# Patient Record
Sex: Female | Born: 1980 | Race: Black or African American | Hispanic: No | Marital: Single | Smoking: Former smoker
Health system: Southern US, Community
[De-identification: ages and names within clinical notes are randomized; demographics above are authoritative.]

## PROBLEM LIST (undated history)

## (undated) DIAGNOSIS — I10 Essential (primary) hypertension: Secondary | ICD-10-CM

## (undated) HISTORY — DX: Essential (primary) hypertension: I10

---

## 2002-05-31 ENCOUNTER — Emergency Department (HOSPITAL_COMMUNITY): Admission: EM | Admit: 2002-05-31 | Discharge: 2002-06-01 | Payer: Self-pay | Admitting: *Deleted

## 2002-05-31 ENCOUNTER — Encounter: Payer: Self-pay | Admitting: *Deleted

## 2002-06-01 ENCOUNTER — Encounter: Payer: Self-pay | Admitting: *Deleted

## 2002-06-05 ENCOUNTER — Emergency Department (HOSPITAL_COMMUNITY): Admission: EM | Admit: 2002-06-05 | Discharge: 2002-06-05 | Payer: Self-pay | Admitting: *Deleted

## 2008-11-04 DIAGNOSIS — I10 Essential (primary) hypertension: Secondary | ICD-10-CM

## 2008-11-04 HISTORY — DX: Essential (primary) hypertension: I10

## 2012-10-28 ENCOUNTER — Encounter (HOSPITAL_COMMUNITY): Payer: Self-pay | Admitting: *Deleted

## 2012-10-28 ENCOUNTER — Emergency Department (HOSPITAL_COMMUNITY)
Admission: EM | Admit: 2012-10-28 | Discharge: 2012-10-28 | Disposition: A | Discharge status: Home / Self Care | Payer: Self-pay | Attending: Emergency Medicine | Admitting: Emergency Medicine

## 2012-10-28 DIAGNOSIS — F17200 Nicotine dependence, unspecified, uncomplicated: Secondary | ICD-10-CM | POA: Insufficient documentation

## 2012-10-28 DIAGNOSIS — R05 Cough: Secondary | ICD-10-CM

## 2012-10-28 DIAGNOSIS — R6883 Chills (without fever): Secondary | ICD-10-CM | POA: Insufficient documentation

## 2012-10-28 DIAGNOSIS — R51 Headache: Secondary | ICD-10-CM | POA: Insufficient documentation

## 2012-10-28 DIAGNOSIS — R5381 Other malaise: Secondary | ICD-10-CM | POA: Insufficient documentation

## 2012-10-28 DIAGNOSIS — J329 Chronic sinusitis, unspecified: Secondary | ICD-10-CM | POA: Insufficient documentation

## 2012-10-28 DIAGNOSIS — J069 Acute upper respiratory infection, unspecified: Secondary | ICD-10-CM | POA: Insufficient documentation

## 2012-10-28 DIAGNOSIS — R52 Pain, unspecified: Secondary | ICD-10-CM | POA: Insufficient documentation

## 2012-10-28 MED ORDER — OXYCODONE-ACETAMINOPHEN 5-325 MG PO TABS
1.0000 | ORAL_TABLET | Freq: Once | ORAL | Status: AC
  Administered 2012-10-28: 1 via ORAL
  Filled 2012-10-28: qty 1

## 2012-10-28 MED ORDER — BENZONATATE 100 MG PO CAPS: 100.0000 mg | ORAL_CAPSULE | Freq: Three times a day (TID) | ORAL | Status: DC | PRN

## 2012-10-28 MED ORDER — ALBUTEROL SULFATE HFA 108 (90 BASE) MCG/ACT IN AERS: INHALATION_SPRAY | RESPIRATORY_TRACT | Status: DC

## 2012-10-28 MED ORDER — IBUPROFEN 400 MG PO TABS
400.0000 mg | ORAL_TABLET | Freq: Once | ORAL | Status: AC
  Administered 2012-10-28: 400 mg via ORAL
  Filled 2012-10-28: qty 1

## 2012-10-28 MED ORDER — TRAMADOL HCL 50 MG PO TABS: 50.0000 mg | ORAL_TABLET | Freq: Four times a day (QID) | ORAL | Status: DC | PRN

## 2012-10-28 NOTE — ED Provider Notes (Signed)
History     CSN: [id number]  Arrival date & time 10/28/12  1541   First MD Initiated Contact with Patient 10/28/12 1624      Chief Complaint  Patient presents with  . Facial Pain  . Cough  . Nasal Congestion     HPI Pt was seen at 1630.   Per pt, c/o gradual onset and persistence of constant runny/stuffy nose, sinus and ears congestion, chills, nausea, generalized body aches/fatigue and cough for the past 2-3 days.  States her face "hurts" when she coughs or bends over forward.  Denies fevers, no rash, no CP/SOB, no vomiting/diarrhea, no abd pain.     History reviewed. No pertinent past medical history.  History reviewed. No pertinent past surgical history.   History  Substance Use Topics  . Smoking status: Current Every Day Smoker    Types: Cigarettes  . Smokeless tobacco: Not on file  . Alcohol Use: No    Review of Systems ROS: Statement: All systems negative except as marked or noted in the HPI; Constitutional: Negative for fever and +chills, generalized weakness/fatigue.. ; ; Eyes: Negative for eye pain, redness and discharge. ; ; ENMT: Negative for ear pain, hoarseness, sore throat. +nasal and ears congestion, sinus pressure, "facial pain," and rhinorrhea. ; ; Cardiovascular: Negative for chest pain, palpitations, diaphoresis, dyspnea and peripheral edema. ; ; Respiratory: +cough. Negative for wheezing and stridor. ; ; Gastrointestinal: +nausea. Negative for vomiting, diarrhea, abdominal pain, blood in stool, hematemesis, jaundice and rectal bleeding. . ; ; Genitourinary: Negative for dysuria, flank pain and hematuria. ; ; Musculoskeletal: Negative for back pain and neck pain. Negative for swelling and trauma.; ; Skin: Negative for pruritus, rash, abrasions, blisters, bruising and skin lesion.; ; Neuro: Negative for headache, lightheadedness and neck stiffness. Negative for weakness, altered level of consciousness , altered mental status, extremity weakness, paresthesias,  involuntary movement, seizure and syncope.     Allergies  Review of patient's allergies indicates no known allergies.  Home Medications   Current Outpatient Rx  Name  Route  Sig  Dispense  Refill  . ALBUTEROL SULFATE HFA 108 (90 BASE) MCG/ACT IN AERS      2 to 4 puffs every 4 hours for the next 7 days, then as needed for cough, wheezing, or shortness of breath   1 Inhaler   0   . BENZONATATE 100 MG PO CAPS   Oral   Take 1 capsule (100 mg total) by mouth 3 (three) times daily as needed for cough.   15 capsule   0   . TRAMADOL HCL 50 MG PO TABS   Oral   Take 1 tablet (50 mg total) by mouth every 6 (six) hours as needed for pain.   15 tablet   0     BP 135/70  Pulse 134  Temp 98.5 F (36.9 C) (Oral)  Resp 20  Ht 5\' 3"  (1.6 m)  Wt 135 lb (61.236 kg)  BMI 23.91 kg/m2  SpO2 100%  LMP 10/23/2012  Physical Exam 1635: Physical examination:  Nursing notes reviewed; Vital signs and O2 SAT reviewed;  Constitutional: Well developed, Well nourished, Well hydrated, In no acute distress; Head:  Normocephalic, atraumatic; Eyes: EOMI, PERRL, No scleral icterus; ENMT: TM's clear bilat. +edemetous nasal turbinates bilat with clear rhinorrhea. +TTP frontal and bilat maxillary sinuses to percuss. Mouth and pharynx without lesions. No tonsillar exudates. No intra-oral edema. No hoarse voice, no drooling, no stridor.  Mouth and pharynx normal, Mucous membranes moist; Neck:  Supple, Full range of motion, No lymphadenopathy; Cardiovascular: Regular rate and rhythm, No murmur, rub, or gallop; Respiratory: Breath sounds clear & equal bilaterally, No rales, rhonchi, wheezes. +non-productive cough during exam. Speaking full sentences with ease, Normal respiratory effort/excursion; Chest: Nontender, Movement normal;; Extremities: Pulses normal, No tenderness, No edema, No calf edema or asymmetry.; Neuro: AA&Ox3, Major CN grossly intact.  Speech clear. Climbs on and off stretcher by herself without  distress. Gait steady. No gross focal motor or sensory deficits in extremities.; Skin: Color normal, Warm, Dry.   ED Course  Procedures    MDM  MDM Reviewed: nursing note and vitals      1645:  Appears URI at this time.  Will tx symptomatically.  Dx d/w pt and family.  Questions answered.  Verb understanding, agreeable to d/c home with outpt f/u.       Laray Anger, DO 10/30/12 [phone]

## 2012-10-28 NOTE — ED Notes (Signed)
Pt c/o HA x  2 days with nausea, denies vomiting, sensitive to light , denies hx of migraines, also c/o cold with coughing pressure to head

## 2013-06-30 ENCOUNTER — Encounter (HOSPITAL_COMMUNITY): Payer: Self-pay | Admitting: Emergency Medicine

## 2013-06-30 ENCOUNTER — Emergency Department (HOSPITAL_COMMUNITY)
Admission: EM | Admit: 2013-06-30 | Discharge: 2013-06-30 | Disposition: A | Discharge status: Home / Self Care | Payer: Self-pay | Attending: Emergency Medicine | Admitting: Emergency Medicine

## 2013-06-30 DIAGNOSIS — F17200 Nicotine dependence, unspecified, uncomplicated: Secondary | ICD-10-CM | POA: Insufficient documentation

## 2013-06-30 DIAGNOSIS — K0889 Other specified disorders of teeth and supporting structures: Secondary | ICD-10-CM

## 2013-06-30 DIAGNOSIS — K029 Dental caries, unspecified: Secondary | ICD-10-CM | POA: Insufficient documentation

## 2013-06-30 DIAGNOSIS — R51 Headache: Secondary | ICD-10-CM | POA: Insufficient documentation

## 2013-06-30 DIAGNOSIS — K089 Disorder of teeth and supporting structures, unspecified: Secondary | ICD-10-CM | POA: Insufficient documentation

## 2013-06-30 MED ORDER — AMOXICILLIN-POT CLAVULANATE 875-125 MG PO TABS
1.0000 | ORAL_TABLET | Freq: Once | ORAL | Status: AC
  Administered 2013-06-30: 1 via ORAL
  Filled 2013-06-30: qty 1

## 2013-06-30 MED ORDER — MELOXICAM 7.5 MG PO TABS: ORAL_TABLET | ORAL | Status: DC

## 2013-06-30 MED ORDER — TRAMADOL HCL 50 MG PO TABS
50.0000 mg | ORAL_TABLET | Freq: Once | ORAL | Status: AC
  Administered 2013-06-30: 50 mg via ORAL
  Filled 2013-06-30: qty 1

## 2013-06-30 MED ORDER — PROMETHAZINE HCL 12.5 MG PO TABS
12.5000 mg | ORAL_TABLET | Freq: Once | ORAL | Status: AC
  Administered 2013-06-30: 12.5 mg via ORAL
  Filled 2013-06-30: qty 1

## 2013-06-30 MED ORDER — AMOXICILLIN 500 MG PO CAPS: 500.0000 mg | ORAL_CAPSULE | Freq: Three times a day (TID) | ORAL | Status: DC

## 2013-06-30 MED ORDER — KETOROLAC TROMETHAMINE 10 MG PO TABS
10.0000 mg | ORAL_TABLET | Freq: Once | ORAL | Status: AC
  Administered 2013-06-30: 10 mg via ORAL
  Filled 2013-06-30: qty 1

## 2013-06-30 MED ORDER — TRAMADOL HCL 50 MG PO TABS: ORAL_TABLET | ORAL | Status: DC

## 2013-06-30 NOTE — ED Notes (Signed)
Dental pain rt side of face.

## 2013-06-30 NOTE — ED Provider Notes (Signed)
Medical screening examination/treatment/procedure(s) were performed by non-physician practitioner and as supervising physician I was immediately available for consultation/collaboration.  Zanyah Lentsch R. Inaaya Vellucci, MD 06/30/13 2355 

## 2013-06-30 NOTE — ED Notes (Signed)
Pt c/o dental pain x 2 days.  

## 2013-06-30 NOTE — ED Provider Notes (Signed)
CSN: [id number]     Arrival date & time 06/30/13  2152 History   First MD Initiated Contact with Patient 06/30/13 2250     Chief Complaint  Patient presents with  . Dental Pain   (Consider location/radiation/quality/duration/timing/severity/associated sxs/prior Treatment) Patient is a 32 y.o. female presenting with tooth pain. The history is provided by the patient.  Dental Pain Location:  Lower Quality:  Throbbing Severity:  Severe Onset quality:  Gradual Duration:  3 days Timing:  Intermittent Progression:  Worsening Chronicity:  New Context: dental caries   Context: not trauma   Relieved by:  Nothing Ineffective treatments:  NSAIDs Associated symptoms: facial pain, gum swelling and headaches   Associated symptoms: no difficulty swallowing and no neck pain   Risk factors: smoking     History reviewed. No pertinent past medical history. History reviewed. No pertinent past surgical history. History reviewed. No pertinent family history. History  Substance Use Topics  . Smoking status: Current Every Day Smoker    Types: Cigarettes  . Smokeless tobacco: Not on file  . Alcohol Use: No   OB History   Grav Para Term Preterm Abortions TAB SAB Ect Mult Living                 Review of Systems  Constitutional: Negative for activity change.       All ROS Neg except as noted in HPI  HENT: Negative for nosebleeds and neck pain.   Eyes: Negative for photophobia and discharge.  Respiratory: Negative for cough, shortness of breath and wheezing.   Cardiovascular: Negative for chest pain and palpitations.  Gastrointestinal: Negative for abdominal pain and blood in stool.  Genitourinary: Negative for dysuria, frequency and hematuria.  Musculoskeletal: Negative for back pain and arthralgias.  Skin: Negative.   Neurological: Positive for headaches. Negative for dizziness, seizures and speech difficulty.  Psychiatric/Behavioral: Negative for hallucinations and confusion.     Allergies  Review of patient's allergies indicates no known allergies.  Home Medications   Current Outpatient Rx  Name  Route  Sig  Dispense  Refill  . benzocaine (ORAJEL) 10 % mucosal gel   Mouth/Throat   Use as directed 1 application in the mouth or throat as needed for pain.         . hydrogen peroxide 3 % external solution   Oral   Take by mouth as needed (Swish and spit as needed for dental pain).         Marland Kitchen ibuprofen (ADVIL,MOTRIN) 200 MG tablet   Oral   Take 800 mg by mouth every 4 (four) hours as needed for pain.         . naproxen sodium (ALEVE) 220 MG tablet   Oral   Take 220 mg by mouth as needed (for pain).         Marland Kitchen amoxicillin (AMOXIL) 500 MG capsule   Oral   Take 1 capsule (500 mg total) by mouth 3 (three) times daily.   21 capsule   0   . meloxicam (MOBIC) 7.5 MG tablet      1 po bid with food   14 tablet   0   . traMADol (ULTRAM) 50 MG tablet      1 or 2 po q6h prn pain, take with food   20 tablet   0    BP 138/89  Pulse 86  Temp(Src) 98 F (36.7 C) (Oral)  Resp 20  Ht 5\' 2"  (1.575 m)  Wt 187 lb 4.8  oz (84.959 kg)  BMI 34.25 kg/m2  SpO2 99%  LMP 06/29/2013 Physical Exam  Nursing note and vitals reviewed. Constitutional: She is oriented to person, place, and time. She appears well-developed and well-nourished.  Non-toxic appearance.  HENT:  Head: Normocephalic.  Right Ear: Tympanic membrane and external ear normal.  Left Ear: Tympanic membrane and external ear normal.  Dental caries of the right upper and lower jaws. Gum swelling of the lower jaw. No swelling under the tongue. Airway patent.  Eyes: EOM and lids are normal. Pupils are equal, round, and reactive to light.  Neck: Normal range of motion. Neck supple. Carotid bruit is not present.  Cardiovascular: Normal rate, regular rhythm, normal heart sounds, intact distal pulses and normal pulses.   Pulmonary/Chest: Breath sounds normal. No respiratory distress.  Abdominal:  Soft. Bowel sounds are normal. There is no tenderness. There is no guarding.  Musculoskeletal: Normal range of motion.  Lymphadenopathy:       Head (right side): No submandibular adenopathy present.       Head (left side): No submandibular adenopathy present.    She has no cervical adenopathy.  Neurological: She is alert and oriented to person, place, and time. She has normal strength. No cranial nerve deficit or sensory deficit.  Skin: Skin is warm and dry.  Psychiatric: She has a normal mood and affect. Her speech is normal.    ED Course  Procedures (including critical care time) Labs Review Labs Reviewed - No data to display Imaging Review No results found.  MDM   1. Toothache    **I have reviewed nursing notes, vital signs, and all appropriate lab and imaging results for this patient.*  Pt presents to ED with 3 days of tooth pain, face pain, and headache. Pt has dental caries on the top and bottom right jaws. Rx for amoxil, mobic, and tramadol given to the patient. Dental resource sheets given.  Kathie Dike, PA-C 06/30/13 2328

## 2014-09-01 ENCOUNTER — Encounter (HOSPITAL_COMMUNITY): Payer: Self-pay | Admitting: Emergency Medicine

## 2014-09-01 ENCOUNTER — Emergency Department (HOSPITAL_COMMUNITY): Payer: Self-pay

## 2014-09-01 ENCOUNTER — Emergency Department (HOSPITAL_COMMUNITY)
Admission: EM | Admit: 2014-09-01 | Discharge: 2014-09-01 | Disposition: A | Discharge status: Home / Self Care | Payer: Self-pay | Attending: Emergency Medicine | Admitting: Emergency Medicine

## 2014-09-01 DIAGNOSIS — Z3202 Encounter for pregnancy test, result negative: Secondary | ICD-10-CM | POA: Insufficient documentation

## 2014-09-01 DIAGNOSIS — N841 Polyp of cervix uteri: Secondary | ICD-10-CM | POA: Insufficient documentation

## 2014-09-01 DIAGNOSIS — R110 Nausea: Secondary | ICD-10-CM | POA: Insufficient documentation

## 2014-09-01 DIAGNOSIS — R109 Unspecified abdominal pain: Secondary | ICD-10-CM

## 2014-09-01 DIAGNOSIS — Z720 Tobacco use: Secondary | ICD-10-CM | POA: Insufficient documentation

## 2014-09-01 DIAGNOSIS — R509 Fever, unspecified: Secondary | ICD-10-CM | POA: Insufficient documentation

## 2014-09-01 DIAGNOSIS — D259 Leiomyoma of uterus, unspecified: Secondary | ICD-10-CM | POA: Insufficient documentation

## 2014-09-01 DIAGNOSIS — R102 Pelvic and perineal pain: Secondary | ICD-10-CM

## 2014-09-01 LAB — COMPREHENSIVE METABOLIC PANEL
ALK PHOS: 106 U/L (ref 39–117)
ALT: 14 U/L (ref 0–35)
ANION GAP: 15 (ref 5–15)
AST: 18 U/L (ref 0–37)
Albumin: 4.2 g/dL (ref 3.5–5.2)
BUN: 9 mg/dL (ref 6–23)
CALCIUM: 9.4 mg/dL (ref 8.4–10.5)
CO2: 22 mEq/L (ref 19–32)
Chloride: 102 mEq/L (ref 96–112)
Creatinine, Ser: 0.72 mg/dL (ref 0.50–1.10)
GLUCOSE: 98 mg/dL (ref 70–99)
POTASSIUM: 3.2 meq/L — AB (ref 3.7–5.3)
Sodium: 139 mEq/L (ref 137–147)
TOTAL PROTEIN: 8.1 g/dL (ref 6.0–8.3)
Total Bilirubin: 0.7 mg/dL (ref 0.3–1.2)

## 2014-09-01 LAB — CBC WITH DIFFERENTIAL/PLATELET
Basophils Absolute: 0 10*3/uL (ref 0.0–0.1)
Basophils Relative: 0 % (ref 0–1)
EOS ABS: 0.1 10*3/uL (ref 0.0–0.7)
EOS PCT: 1 % (ref 0–5)
HCT: 39.1 % (ref 36.0–46.0)
HEMOGLOBIN: 12.9 g/dL (ref 12.0–15.0)
LYMPHS ABS: 3.6 10*3/uL (ref 0.7–4.0)
Lymphocytes Relative: 37 % (ref 12–46)
MCH: 25.2 pg — AB (ref 26.0–34.0)
MCHC: 33 g/dL (ref 30.0–36.0)
MCV: 76.4 fL — AB (ref 78.0–100.0)
MONOS PCT: 7 % (ref 3–12)
Monocytes Absolute: 0.7 10*3/uL (ref 0.1–1.0)
Neutro Abs: 5.3 10*3/uL (ref 1.7–7.7)
Neutrophils Relative %: 55 % (ref 43–77)
Platelets: 284 10*3/uL (ref 150–400)
RBC: 5.12 MIL/uL — AB (ref 3.87–5.11)
RDW: 14.6 % (ref 11.5–15.5)
WBC: 9.7 10*3/uL (ref 4.0–10.5)

## 2014-09-01 LAB — URINALYSIS, ROUTINE W REFLEX MICROSCOPIC
Glucose, UA: NEGATIVE mg/dL
KETONES UR: NEGATIVE mg/dL
Leukocytes, UA: NEGATIVE
Nitrite: NEGATIVE
PROTEIN: 30 mg/dL — AB
Specific Gravity, Urine: >1.046 — ABNORMAL HIGH (ref 1.005–1.030)
UROBILINOGEN UA: 1 mg/dL (ref 0.0–1.0)
pH: 6 (ref 5.0–8.0)

## 2014-09-01 LAB — URINE MICROSCOPIC-ADD ON

## 2014-09-01 LAB — PREGNANCY, URINE: PREG TEST UR: NEGATIVE

## 2014-09-01 LAB — WET PREP, GENITAL
Clue Cells Wet Prep HPF POC: NONE SEEN
Trich, Wet Prep: NONE SEEN
WBC WET PREP: NONE SEEN
YEAST WET PREP: NONE SEEN

## 2014-09-01 LAB — LIPASE, BLOOD: Lipase: 17 U/L (ref 11–59)

## 2014-09-01 MED ORDER — FENTANYL CITRATE 0.05 MG/ML IJ SOLN
100.0000 ug | Freq: Once | INTRAMUSCULAR | Status: AC
  Administered 2014-09-01: 100 ug via INTRAVENOUS
  Filled 2014-09-01: qty 2

## 2014-09-01 MED ORDER — SODIUM CHLORIDE 0.9 % IV BOLUS (SEPSIS)
1000.0000 mL | Freq: Once | INTRAVENOUS | Status: AC
Start: 2014-09-01 — End: 2014-09-01
  Administered 2014-09-01: 1000 mL via INTRAVENOUS

## 2014-09-01 MED ORDER — ONDANSETRON HCL 4 MG/2ML IJ SOLN
4.0000 mg | Freq: Once | INTRAMUSCULAR | Status: AC
  Administered 2014-09-01: 4 mg via INTRAVENOUS
  Filled 2014-09-01: qty 2

## 2014-09-01 MED ORDER — TRAMADOL HCL 50 MG PO TABS: 50.0000 mg | ORAL_TABLET | Freq: Four times a day (QID) | ORAL | Status: DC | PRN

## 2014-09-01 NOTE — ED Notes (Signed)
Pt is c/o bilateral flank pain and urinary retention   Pt states the pain first started on the left and has now spread to both sides  Pt states her sides are tender and has a burning sensation to the kidney areas  Pt states when she goes to urinate she can only dribble

## 2014-09-01 NOTE — ED Notes (Signed)
Pelvic exam has been set up.

## 2014-09-01 NOTE — ED Provider Notes (Signed)
CSN: [id number]     Arrival date & time 09/01/14  0004 History   First MD Initiated Contact with Patient 09/01/14 0018     Chief Complaint  Patient presents with  . Flank Pain   Patient is a 33 y.o. female presenting with flank pain. The history is provided by the patient. No language interpreter was used.  Flank Pain This is a new problem. The current episode started 1 to 4 weeks ago. The problem occurs constantly. The problem has been gradually worsening. Associated symptoms include abdominal pain, chills, fatigue, a fever, nausea and urinary symptoms. Pertinent negatives include no anorexia, arthralgias, change in bowel habit, chest pain, congestion, coughing, diaphoresis, headaches, joint swelling, myalgias, neck pain, numbness, rash, sore throat, swollen glands, vomiting or weakness.     History reviewed. No pertinent past medical history. History reviewed. No pertinent past surgical history. Family History  Problem Relation Age of Onset  . Hypertension Other   . Cancer Other    History  Substance Use Topics  . Smoking status: Current Every Day Smoker    Types: Cigarettes  . Smokeless tobacco: Not on file  . Alcohol Use: No   OB History   Grav Para Term Preterm Abortions TAB SAB Ect Mult Living                 Review of Systems  Constitutional: Positive for fever, chills and fatigue. Negative for diaphoresis.  HENT: Negative for congestion and sore throat.   Respiratory: Negative for cough and chest tightness.   Cardiovascular: Negative for chest pain.  Gastrointestinal: Positive for nausea and abdominal pain. Negative for vomiting, anorexia and change in bowel habit.  Genitourinary: Positive for flank pain. Negative for dysuria, urgency, frequency, hematuria, vaginal bleeding, vaginal discharge, difficulty urinating and vaginal pain.  Musculoskeletal: Positive for back pain. Negative for arthralgias, joint swelling, myalgias and neck pain.  Skin: Negative for rash.   Neurological: Negative for weakness, numbness and headaches.  All other systems reviewed and are negative.     Allergies  Review of patient's allergies indicates no known allergies.  Home Medications   Prior to Admission medications   Medication Sig Start Date End Date Taking? Authorizing Provider  acetaminophen (TYLENOL) 325 MG tablet Take by mouth every 6 (six) hours as needed for mild pain.   Yes Historical Provider, MD   BP 150/93  Pulse 64  Temp(Src) 97.8 F (36.6 C) (Oral)  Resp 16  SpO2 100%  LMP 08/31/2014 Physical Exam  Nursing note and vitals reviewed. Constitutional: She is oriented to person, place, and time. She appears well-developed and well-nourished. No distress.  HENT:  Head: Normocephalic and atraumatic.  Mouth/Throat: Oropharynx is clear and moist. No oropharyngeal exudate.  Eyes: Conjunctivae and EOM are normal. Pupils are equal, round, and reactive to light. No scleral icterus.  Neck: Normal range of motion. Neck supple. No JVD present. No thyromegaly present.  Cardiovascular: Normal rate, regular rhythm, normal heart sounds and intact distal pulses.  Exam reveals no gallop and no friction rub.   No murmur heard. Pulmonary/Chest: Effort normal and breath sounds normal. No respiratory distress. She has no wheezes. She has no rales. She exhibits no tenderness.  Abdominal: Soft. Normal appearance and bowel sounds are normal. She exhibits no distension and no mass. There is tenderness in the suprapubic area. There is CVA tenderness (bilateral). There is no rigidity, no rebound, no guarding, no tenderness at McBurney's point and negative Murphy's sign.  Genitourinary: No labial fusion. There  is no rash, tenderness, lesion or injury on the right labia. There is no rash, tenderness, lesion or injury on the left labia. Uterus is tender. Cervix exhibits motion tenderness. Cervix exhibits no discharge and no friability. Right adnexum displays tenderness. Right adnexum  displays no mass and no fullness. Left adnexum displays tenderness. Left adnexum displays no mass and no fullness. There is tenderness around the vagina. No erythema or bleeding around the vagina. No foreign body around the vagina. No signs of injury around the vagina. No vaginal discharge found.  Polyp located on the cervix.   Musculoskeletal: Normal range of motion.  Lymphadenopathy:    She has no cervical adenopathy.  Neurological: She is alert and oriented to person, place, and time. She has normal strength. No cranial nerve deficit or sensory deficit. Coordination normal.  Skin: Skin is warm and dry. She is not diaphoretic.  Psychiatric: She has a normal mood and affect. Her behavior is normal. Judgment and thought content normal.    ED Course  Procedures (including critical care time) Labs Review Labs Reviewed  CBC WITH DIFFERENTIAL - Abnormal; Notable for the following:    RBC 5.12 (*)    MCV 76.4 (*)    MCH 25.2 (*)    All other components within normal limits  COMPREHENSIVE METABOLIC PANEL - Abnormal; Notable for the following:    Potassium 3.2 (*)    All other components within normal limits  URINALYSIS, ROUTINE W REFLEX MICROSCOPIC - Abnormal; Notable for the following:    APPearance CLOUDY (*)    Specific Gravity, Urine >1.046 (*)    Hgb urine dipstick LARGE (*)    Bilirubin Urine SMALL (*)    Protein, ur 30 (*)    All other components within normal limits  URINE MICROSCOPIC-ADD ON - Abnormal; Notable for the following:    Squamous Epithelial / LPF FEW (*)    All other components within normal limits  WET PREP, GENITAL  GC/CHLAMYDIA PROBE AMP  LIPASE, BLOOD  PREGNANCY, URINE    Imaging Review Ct Abdomen Pelvis Wo Contrast  09/01/2014   CLINICAL DATA:  Left flank pain, urinary retention.  EXAM: CT ABDOMEN AND PELVIS WITHOUT CONTRAST  TECHNIQUE: Multidetector CT imaging of the abdomen and pelvis was performed following the standard protocol without IV contrast.   COMPARISON:  06/01/2002  FINDINGS: Mild lingular opacity.  Normal heart size.  Organ evaluation limited without intravenous contrast. Within this limitation, small rounded hypodensity right hepatic lobe posteriorly series 2, image 11.  No radiodense gallstones or biliary ductal dilatation.  No appreciable abnormality of the spleen, pancreas, adrenal glands.  Symmetric renal size. No hydroureteronephrosis or urinary tract calculi identified.  No overt colitis. Normal appendix. Small bowel loops are of normal course and caliber. Small fat containing umbilical hernia. No free intraperitoneal air or fluid. No lymphadenopathy.  Normal caliber aorta and branch vessels.  Thin walled bladder. Calcified uterine lesion, favored to reflect a fibroid. No adnexal mass. Tampon within the vagina.  No acute osseous finding.  IMPRESSION: No hydroureteronephrosis or urinary tract calculi.  Uterine fibroid.  Mild lingular opacity, favor atelectasis.  Nonspecific sub cm hypodensity right hepatic lobe, has a nonaggressive appearance.   Electronically Signed   By: Carlos Levering M.D.   On: 09/01/2014 03:34     EKG Interpretation None      MDM   Final diagnoses:  Flank pain  Pelvic pain in female   Patient is a 33 y.o. Female who presents to the ED  with bilateral flank pain and dysuria.  Physical exam reveals uncomfortable appearing female in no apparent distress with with bilateral CVA tenderness and pelvic tenderness to palpation of pelvic exam.  UA had 0-2 RBCs and no evidence of UTI.  CBC is unremarkable.  CMP reveals mild hypokalemia.  Urine pregnancy is negative.  Wet prep is negative.  GC pending.  CT abdomen and pelvis was performed to rule out kidney stones.  There is no evidence of kidney or ureteral stones.  Given tenderness to palpation of the uterus, cervical polyp, and adnexal tenderness pelvic ultrasound was ordered.  Patient to be signed out with Noland Fordyce PA-C at shift change.  If the pelvic ultrasound  is normal patient is stable for discharge and can [facility] and [facility] and should be discharged home with a short course of pain medication.  Patient has been discussed with Dr. Sharol Given who agrees with the above plan and workup.       Cherylann Parr, PA-C 09/01/14 [phone]

## 2014-09-01 NOTE — ED Provider Notes (Signed)
Medical screening examination/treatment/procedure(s) were performed by non-physician practitioner and as supervising physician I was immediately available for consultation/collaboration.   EKG Interpretation None       Kalman Drape, MD 09/01/14 0700

## 2014-09-01 NOTE — ED Provider Notes (Signed)
Pt signed out at shift change. Plan to f/u on pelvic U/S. 7:38 AM Pt refused transvaginal U/S stating she is on her cycle and has a tampon in, plans to f/u with a GYN anyways.  Labs: unremarkable.  Pt states her pain is better and she wants to go home. Medical records reviewed, no evidence of emergent process taking place at this time. Will discharge home with pain medication and resource guide for [facility]. Return precautions provided. Pt verbalized understanding and agreement with tx plan.    Noland Fordyce, PA-C 09/01/14 [phone]

## 2014-09-02 LAB — GC/CHLAMYDIA PROBE AMP
CT PROBE, AMP APTIMA: NEGATIVE
GC PROBE AMP APTIMA: NEGATIVE

## 2014-09-02 NOTE — ED Provider Notes (Signed)
Medical screening examination/treatment/procedure(s) were performed by non-physician practitioner and as supervising physician I was immediately available for consultation/collaboration.   EKG Interpretation None       Kalman Drape, MD 09/02/14 [phone]

## 2014-11-05 ENCOUNTER — Encounter (HOSPITAL_COMMUNITY): Payer: Self-pay | Admitting: Emergency Medicine

## 2014-11-05 ENCOUNTER — Emergency Department (HOSPITAL_COMMUNITY): Payer: Self-pay

## 2014-11-05 ENCOUNTER — Emergency Department (HOSPITAL_COMMUNITY)
Admission: EM | Admit: 2014-11-05 | Discharge: 2014-11-05 | Disposition: A | Discharge status: Home / Self Care | Payer: Self-pay | Attending: Emergency Medicine | Admitting: Emergency Medicine

## 2014-11-05 DIAGNOSIS — Z720 Tobacco use: Secondary | ICD-10-CM | POA: Insufficient documentation

## 2014-11-05 DIAGNOSIS — R1011 Right upper quadrant pain: Secondary | ICD-10-CM | POA: Insufficient documentation

## 2014-11-05 DIAGNOSIS — R630 Anorexia: Secondary | ICD-10-CM | POA: Insufficient documentation

## 2014-11-05 DIAGNOSIS — Z8742 Personal history of other diseases of the female genital tract: Secondary | ICD-10-CM | POA: Insufficient documentation

## 2014-11-05 DIAGNOSIS — Z3202 Encounter for pregnancy test, result negative: Secondary | ICD-10-CM | POA: Insufficient documentation

## 2014-11-05 DIAGNOSIS — R112 Nausea with vomiting, unspecified: Secondary | ICD-10-CM | POA: Insufficient documentation

## 2014-11-05 LAB — COMPREHENSIVE METABOLIC PANEL WITH GFR
ALT: 18 U/L (ref 0–35)
AST: 21 U/L (ref 0–37)
Albumin: 4 g/dL (ref 3.5–5.2)
Alkaline Phosphatase: 100 U/L (ref 39–117)
Anion gap: 9 (ref 5–15)
BUN: 9 mg/dL (ref 6–23)
CO2: 25 mmol/L (ref 19–32)
Calcium: 8.6 mg/dL (ref 8.4–10.5)
Chloride: 106 meq/L (ref 96–112)
Creatinine, Ser: 0.73 mg/dL (ref 0.50–1.10)
GFR calc Af Amer: >90 mL/min (ref >90)
GFR calc non Af Amer: >90 mL/min (ref >90)
Glucose, Bld: 85 mg/dL (ref 70–99)
Potassium: 4 mmol/L (ref 3.5–5.1)
Sodium: 140 mmol/L (ref 135–145)
Total Bilirubin: 0.9 mg/dL (ref 0.3–1.2)
Total Protein: 7.4 g/dL (ref 6.0–8.3)

## 2014-11-05 LAB — CBC WITH DIFFERENTIAL/PLATELET
BASOS ABS: 0 10*3/uL (ref 0.0–0.1)
BASOS PCT: 0 % (ref 0–1)
EOS PCT: 1 % (ref 0–5)
Eosinophils Absolute: 0.1 10*3/uL (ref 0.0–0.7)
HEMATOCRIT: 39.2 % (ref 36.0–46.0)
HEMOGLOBIN: 12.5 g/dL (ref 12.0–15.0)
Lymphocytes Relative: 32 % (ref 12–46)
Lymphs Abs: 2.7 10*3/uL (ref 0.7–4.0)
MCH: 25 pg — AB (ref 26.0–34.0)
MCHC: 31.9 g/dL (ref 30.0–36.0)
MCV: 78.4 fL (ref 78.0–100.0)
MONO ABS: 0.5 10*3/uL (ref 0.1–1.0)
MONOS PCT: 5 % (ref 3–12)
Neutro Abs: 5.3 10*3/uL (ref 1.7–7.7)
Neutrophils Relative %: 62 % (ref 43–77)
Platelets: 335 10*3/uL (ref 150–400)
RBC: 5 MIL/uL (ref 3.87–5.11)
RDW: 15.1 % (ref 11.5–15.5)
WBC: 8.6 10*3/uL (ref 4.0–10.5)

## 2014-11-05 LAB — POC URINE PREG, ED: Preg Test, Ur: NEGATIVE

## 2014-11-05 LAB — URINALYSIS, ROUTINE W REFLEX MICROSCOPIC
Glucose, UA: NEGATIVE mg/dL
Hgb urine dipstick: NEGATIVE
Ketones, ur: NEGATIVE mg/dL
Leukocytes, UA: NEGATIVE
Nitrite: NEGATIVE
Protein, ur: NEGATIVE mg/dL
Specific Gravity, Urine: 1.026 (ref 1.005–1.030)
Urobilinogen, UA: 0.2 mg/dL (ref 0.0–1.0)
pH: 5.5 (ref 5.0–8.0)

## 2014-11-05 LAB — LIPASE, BLOOD: Lipase: 19 U/L (ref 11–59)

## 2014-11-05 MED ORDER — ONDANSETRON 8 MG PO TBDP
8.0000 mg | ORAL_TABLET | Freq: Once | ORAL | Status: AC
  Administered 2014-11-05: 8 mg via ORAL
  Filled 2014-11-05: qty 1

## 2014-11-05 MED ORDER — ONDANSETRON HCL 4 MG PO TABS: 4.0000 mg | ORAL_TABLET | Freq: Four times a day (QID) | ORAL | Status: DC

## 2014-11-05 MED ORDER — KETOROLAC TROMETHAMINE 30 MG/ML IJ SOLN
30.0000 mg | Freq: Once | INTRAMUSCULAR | Status: AC
  Administered 2014-11-05: 30 mg via INTRAVENOUS
  Filled 2014-11-05: qty 1

## 2014-11-05 MED ORDER — SODIUM CHLORIDE 0.9 % IV BOLUS (SEPSIS)
1000.0000 mL | Freq: Once | INTRAVENOUS | Status: AC
  Administered 2014-11-05: 1000 mL via INTRAVENOUS

## 2014-11-05 NOTE — ED Provider Notes (Signed)
CSN: [id number]     Arrival date & time 11/05/14  1748 History   First MD Initiated Contact with Patient 11/05/14 1856     Chief Complaint  Patient presents with  . Flank Pain  . Abdominal Pain     (Consider location/radiation/quality/duration/timing/severity/associated sxs/prior Treatment) HPI   34 year old female with history of uterine fibroid who presents for evaluation of abdominal pain. Patient reports intermittent and colicky right upper quadrant abdominal pain ongoing for the past month. Pain is described as a burning sensation, lasting for minutes and resolved without specific treatments. She reported decrease in appetite, having occasional nausea and vomiting. Vomitus is nonbloody nonbilious. She did report some mild loose stool several days ago but her bowel movement has been normal. No complaint of fever, chills, chest pain, shortness of breath, productive cough, back pain, dysuria, hematuria, hematochezia or melena. No prior history of abdominal surgery. No specific treatment tried.  History reviewed. No pertinent past medical history. History reviewed. No pertinent past surgical history. Family History  Problem Relation Age of Onset  . Hypertension Other   . Cancer Other    History  Substance Use Topics  . Smoking status: Current Every Day Smoker    Types: Cigarettes  . Smokeless tobacco: Not on file  . Alcohol Use: No     Comment: rarely   OB History    No data available     Review of Systems  All other systems reviewed and are negative.     Allergies  Review of patient's allergies indicates no known allergies.  Home Medications   Prior to Admission medications   Medication Sig Start Date End Date Taking? Authorizing Provider  acetaminophen (TYLENOL) 325 MG tablet Take by mouth every 6 (six) hours as needed for mild pain.    Historical Provider, MD  traMADol (ULTRAM) 50 MG tablet Take 1 tablet (50 mg total) by mouth every 6 (six) hours as needed. 09/01/14    Noland Fordyce, PA-C   BP 154/109 mmHg  Pulse 76  Temp(Src) 98.9 F (37.2 C) (Oral)  Resp 18  SpO2 100%  LMP 11/02/2014 Physical Exam  Constitutional: She appears well-developed and well-nourished. No distress.  HENT:  Head: Atraumatic.  Eyes: Conjunctivae are normal.  Neck: Neck supple.  Cardiovascular: Normal rate and regular rhythm.   Pulmonary/Chest: Effort normal and breath sounds normal.  Abdominal: Soft. She exhibits no distension. There is tenderness (Right upper quadrant abdominal tenderness without guarding or rebound tenderness. Negative Murphy sign, no pain at McBurney's point.). There is no rebound and no guarding.  Genitourinary:  No CVA tenderness.  Neurological: She is alert.  Skin: No rash noted.  Psychiatric: She has a normal mood and affect.  Nursing note and vitals reviewed.   ED Course  Procedures (including critical care time)  7:10 PM Patient with right upper quadrant abdominal pain suggestive of biliary etiology. Will obtain abdominal ultrasound for further evaluation.  9:06 PM Patient's labs are reassuring. Abdominal ultrasound shows no evidence of biliary disease or any other acute finding. At this time patient is in no acute distress and able to eat without difficulty. Since she had a recent abdominal and pelvis CT scan done less than a week ago without any acute finding couple within normal abdominal ultrasound today I feel patient will be best evaluated further by a primary care provider. Resources provided. Patient agrees with plan. Return precautions discussed.  Labs Review Labs Reviewed  CBC WITH DIFFERENTIAL - Abnormal; Notable for the following:  MCH 25.0 (*)    All other components within normal limits  URINALYSIS, ROUTINE W REFLEX MICROSCOPIC - Abnormal; Notable for the following:    APPearance CLOUDY (*)    Bilirubin Urine SMALL (*)    All other components within normal limits  COMPREHENSIVE METABOLIC PANEL  LIPASE, BLOOD  POC  URINE PREG, ED    Imaging Review US Abdomen Complete  11/05/2014   CLINICAL DATA:  Acute onset of right upper quadrant abdominal pain. Initial encounter.  EXAM: ULTRASOUND ABDOMEN COMPLETE  COMPARISON:  CT of the abdomen and pelvis from 09/01/2014  FINDINGS: Gallbladder: No gallstones or wall thickening visualized. No sonographic Murphy sign noted.  Common bile duct: Diameter: 0.4 cm, within normal limits in caliber.  Liver: No focal lesion identified. Within normal limits in parenchymal echogenicity.  IVC: No abnormality visualized.  Pancreas: Visualized portion unremarkable.  Spleen: Size and appearance within normal limits.  Right Kidney: Length: 11.1 cm. Echogenicity within normal limits. No mass or hydronephrosis visualized.  Left Kidney: Length: 11.9 cm. Echogenicity within normal limits. No mass or hydronephrosis visualized.  Abdominal aorta: No aneurysm visualized.  Other findings: None.  IMPRESSION: Unremarkable abdominal ultrasound.   Electronically Signed   By: Garald Balding M.D.   On: 11/05/2014 20:47     EKG Interpretation None      MDM   Final diagnoses:  RUQ abdominal pain    BP 116/73 mmHg  Pulse 69  Temp(Src) 98.9 F (37.2 C) (Oral)  Resp 18  SpO2 100%  LMP 11/02/2014  I have reviewed nursing notes and vital signs. I personally reviewed the imaging tests through PACS system  I reviewed available ER/hospitalization records thought the EMR     Domenic Moras, PA-C 11/05/14 2108  Charlesetta Shanks, MD 11/06/14 [phone]

## 2014-11-05 NOTE — Discharge Instructions (Signed)
Abdominal Pain, Women °Abdominal (stomach, pelvic, or belly) pain can be caused by many things. It is important to tell your doctor: °· The location of the pain. °· Does it come and go or is it present all the time? °· Are there things that start the pain (eating certain foods, exercise)? °· Are there other symptoms associated with the pain (fever, nausea, vomiting, diarrhea)? °All of this is helpful to know when trying to find the cause of the pain. °CAUSES  °· Stomach: virus or bacteria infection, or ulcer. °· Intestine: appendicitis (inflamed appendix), regional ileitis (Crohn's disease), ulcerative colitis (inflamed colon), irritable bowel syndrome, diverticulitis (inflamed diverticulum of the colon), or cancer of the stomach or intestine. °· Gallbladder disease or stones in the gallbladder. °· Kidney disease, kidney stones, or infection. °· Pancreas infection or cancer. °· Fibromyalgia (pain disorder). °· Diseases of the female organs: °¨ Uterus: fibroid (non-cancerous) tumors or infection. °¨ Fallopian tubes: infection or tubal pregnancy. °¨ Ovary: cysts or tumors. °¨ Pelvic adhesions (scar tissue). °¨ Endometriosis (uterus lining tissue growing in the pelvis and on the pelvic organs). °¨ Pelvic congestion syndrome (female organs filling up with blood just before the menstrual period). °¨ Pain with the menstrual period. °¨ Pain with ovulation (producing an egg). °¨ Pain with an IUD (intrauterine device, birth control) in the uterus. °¨ Cancer of the female organs. °· Functional pain (pain not caused by a disease, may improve without treatment). °· Psychological pain. °· Depression. °DIAGNOSIS  °Your doctor will decide the seriousness of your pain by doing an examination. °· Blood tests. °· X-rays. °· Ultrasound. °· CT scan (computed tomography, special type of X-ray). °· MRI (magnetic resonance imaging). °· Cultures, for infection. °· Barium enema (dye inserted in the large intestine, to better view it with  X-rays). °· Colonoscopy (looking in intestine with a lighted tube). °· Laparoscopy (minor surgery, looking in abdomen with a lighted tube). °· Major abdominal exploratory surgery (looking in abdomen with a large incision). °TREATMENT  °The treatment will depend on the cause of the pain.  °· Many cases can be observed and treated at home. °· Over-the-counter medicines recommended by your caregiver. °· Prescription medicine. °· Antibiotics, for infection. °· Birth control pills, for painful periods or for ovulation pain. °· Hormone treatment, for endometriosis. °· Nerve blocking injections. °· Physical therapy. °· Antidepressants. °· Counseling with a psychologist or psychiatrist. °· Minor or major surgery. °HOME CARE INSTRUCTIONS  °· Do not take laxatives, unless directed by your caregiver. °· Take over-the-counter pain medicine only if ordered by your caregiver. Do not take aspirin because it can cause an upset stomach or bleeding. °· Try a clear liquid diet (broth or water) as ordered by your caregiver. Slowly move to a bland diet, as tolerated, if the pain is related to the stomach or intestine. °· Have a thermometer and take your temperature several times a day, and record it. °· Bed rest and sleep, if it helps the pain. °· Avoid sexual intercourse, if it causes pain. °· Avoid stressful situations. °· Keep your follow-up appointments and tests, as your caregiver orders. °· If the pain does not go away with medicine or surgery, you may try: °¨ Acupuncture. °¨ Relaxation exercises (yoga, meditation). °¨ Group therapy. °¨ Counseling. °SEEK MEDICAL CARE IF:  °· You notice certain foods cause stomach pain. °· Your home care treatment is not helping your pain. °· You need stronger pain medicine. °· You want your IUD removed. °· You feel faint or   lightheaded.  You develop nausea and vomiting.  You develop a rash.  You are having side effects or an allergy to your medicine. SEEK IMMEDIATE MEDICAL CARE IF:   Your  pain does not go away or gets worse.  You have a fever.  Your pain is felt only in portions of the abdomen. The right side could possibly be appendicitis. The left lower portion of the abdomen could be colitis or diverticulitis.  You are passing blood in your stools (bright red or black tarry stools, with or without vomiting).  You have blood in your urine.  You develop chills, with or without a fever.  You pass out. MAKE SURE YOU:   Understand these instructions.  Will watch your condition.  Will get help right away if you are not doing well or get worse. Document Released: 08/18/2007 Document Revised: 03/07/2014 Document Reviewed: 09/07/2009 [facility] Patient Information 2015 [place], [place]. This information is not intended to replace advice given to you by your health care provider. Make sure you discuss any questions you have with your health care provider.   Emergency Department Resource Guide 1) Find a Doctor and Pay Out of Pocket Although you won't have to find out who is covered by your insurance plan, it is a good idea to ask around and get recommendations. You will then need to call the office and see if the doctor you have chosen will accept you as a new patient and what types of options they offer for patients who are self-pay. Some doctors offer discounts or will set up payment plans for their patients who do not have insurance, but you will need to ask so you aren't surprised when you get to your appointment.  2) Contact Your Local Health Department Not all health departments have doctors that can see patients for sick visits, but many do, so it is worth a call to see if yours does. If you don't know where your local health department is, you can check in your phone book. The CDC also has a tool to help you locate your state's health department, and many state websites also have listings of all of their local health departments.  3) Find a [facility] If your illness  is not likely to be very severe or complicated, you may want to try a walk in clinic. These are popping up all over the country in pharmacies, drugstores, and shopping centers. They're usually staffed by nurse practitioners or physician assistants that have been trained to treat common illnesses and complaints. They're usually fairly quick and inexpensive. However, if you have serious medical issues or chronic medical problems, these are probably not your best option.  No Primary Care Doctor: - Call Health Connect at  [phone] - they can help you locate a primary care doctor that  accepts your insurance, provides certain services, etc. - Physician Referral Service- [phone]  Chronic Pain Problems: Organization         Address  Phone   Notes  [facility]  [phone] Patients need to be referred by their primary care doctor.   Medication Assistance: Organization         Address  Phone   Notes  [facility] Medication [facility] [address] [place], [place] [address] [phone] --Must be a resident of [facility] -- Must have NO insurance coverage whatsoever (no Medicaid/ Medicare, etc.) -- The pt. MUST have a primary care doctor that directs their care  regularly and follows them in the community   MedAssist  [phone]   [employer]  [phone]    Agencies that provide inexpensive medical care: Organization         Address  Phone   Notes  [place]  [phone]   [facility]    [phone]   [facility] [place], [place] [address] [phone]   [place] [address] [place] [phone]   Planned Parenthood    [phone]   [facility]    [phone]   [place] [address], [place] Phone:  [phone], Fax:  [phone] Hours of Operation:  9 am - 6  pm, M-F.  Also accepts Medicaid/Medicare and self-pay.  [facility] for [address], [address], [place] Phone: [phone], Fax: [phone]. Hours of Operation:  8:30 am - 5:30 pm, M-F.  Also accepts Medicaid and self-pay.  [facility] [place] [address], [place] Phone: [phone]   [place], [place], [place] [phone], Ext. [phone] Mondays & Thursdays: 7-9 AM.  First 15 patients are seen on a first come, first serve basis.    [place] Providers:  Organization         Address  Phone   Notes  [facility] [address], [place] [phone] Also accepts self-pay patients.  [facility] [address], [place]  [phone]   [place], [address], [place] [phone]   [facility] [address] [place] [phone]   Lucianne Lei [address], [place]   [phone] Only accepts [place] Access [place] patients after they have their name applied to their card.   Self-Pay (no insurance) in [facility]:  Organization         Address  Phone   Notes  Sickle Cell Patients, [facility] [place] [phone]   [facility] [place] [phone]   [facility] [place]  [place], [place], [place] [phone]   [facility]/Dr. Osei-Bonsu  [address], [place] or [address], [place] [phone] Phone number for both [place] and [place] locations is the same.  [facility]gent [facility] and [facility] [address], [place] [phone]   [facility] [address], [place] or [address] [phone]   [facility] [address], [place] [phone], phone; [phone], fax Sees patients 1st and 3rd Saturday of every month.  Must not qualify for public or private insurance (i.e. Medicaid, Medicare, [place] Health Choice, Veterans' Benefits)  Household income should be no more than 200% of the poverty level The clinic cannot treat you if you are pregnant or think you are pregnant  Sexually transmitted diseases are not treated at the clinic.    Dental Care: Organization         Address  Phone  Notes  [facility] [address] [place], [place] [phone] Accepts children up to age 99  who are enrolled in Medicaid or [place] Health Choice; pregnant women with a Medicaid card; and children who have applied for Medicaid or [place] Health Choice, but were declined, whose parents can pay a reduced fee at time of service.  [facility]  [address], [place] [phone] Accepts children up to age 59 who are enrolled in [place] or [place]; pregnant women with a Medicaid card; and children who have applied for Medicaid or  Health Choice, but were declined, whose parents can pay a reduced fee at time of service.  [place] Adult [facility] Access PRO[facility]  [facility] [place] [phone] Patients are seen by appointment only. Walk-ins are not accepted. [place] will see patients 18 years of age and older. Monday - Tuesday (8am-5pm) Most Wednesdays (8:30-5pm) $30 per visit, cash only  [facility] Adult Dental Access PROGRAM  [address], [facility] [phone] Patients are seen by appointment only. Walk-ins are not accepted. [place] will see patients 81 years of age and older. One Wednesday Evening (Monthly: Volunteer Based).  $30 per visit, cash only  [place]  [phone] for adults; Children under age 92, call Graduate Pediatric Dentistry at [phone]. Children aged 36-14, please call [phone] to request a pediatric application.  Dental services are provided in all areas of dental care including fillings, crowns and bridges, complete and partial dentures, implants, gum treatment, root canals, and extractions. Preventive care is also provided. Treatment is provided to both adults and children. Patients are selected via a lottery and there is often a waiting list.   [facility] [address], [place]  [phone] [email or web address]   [facility] [address] [place], [place] [phone], Ext. [phone] Second and Fourth Thursday of each month, opens at 6:30 AM; Clinic ends at 9 AM.  Patients are seen on a first-come first-served basis, and a limited number are seen during each clinic.   [facility]  [address] Hillard Danker [place], [place] [phone]   Eligibility Requirements You must have lived in [place], [place], or [place] counties for at least the last three months.   You cannot be eligible for state or federal sponsored [employer], including [employer], [place], or [employer].   You generally cannot be eligible for healthcare insurance through your employer.    How to apply: Eligibility screenings are held every Tuesday and Wednesday afternoon from 1:00 pm until 4:00 pm. You do not need an appointment for the interview!  [facility] [address], [place], [place]   [place]  [place] Department  [place]  [phone]    Behavioral Health Resources in the Community: Intensive Outpatient Programs Organization         Address  Phone  Notes  [place]. [address] [place], [place] [phone]   [facility] Outpatient [address] [place], [place]   ADS: Alcohol & Drug Svcs  [address] [place], [place]   [place] [address],  [place], [place] or [phone]   Substance Abuse Resources Organization         Address  Phone  Notes  Alcohol and Drug Services  [phone]   [facility]  [place]   Chinita Pester  [phone]   Residential & Outpatient Substance Abuse Program  [phone]   Psychological Services Organization         Address  Phone  Notes  [place]  [place]  [phone]   [place] [address], [place] or [phone]    Mobile Crisis Teams Organization         Address  Phone  Notes  [facility], Mobile Crisis Care Unit  [phone]   [facility]  [address] [place], [place]   [place] [address] [place] [phone]    Self-Help/Support Groups Organization         Address  Phone             Notes  [place]. of [place] - variety of support groups  [place] Call for more information  Narcotics Anonymous (NA), [facility] [address], [employer] [place]  2 meetings at this location   Special educational needs teacher         Address  Phone  Notes  [facility] Residential Treatment [place],    [place]  [phone]   [facility]  [address], [place] [address]25053, [place], [place]   [place], [place] [phone] Admissions: 8am-3pm M-F  Incentives Substance [place] [address]    [place], [place] [phone]   [facility] [address] [place], [place], [place]   [facility] [address],  [place], [place]   [facility] Programs - Intensive Outpatient [address] Kristeen [address], [place], [place]   [facility] ([place].,  [place], [place] [phone] or [phone]   [facility] (RTS) [address], [employer], [place] Accepts Medicaid  [facility] [place] [address],  [place] [phone] Substance Abuse/Addiction Treatment   [facility] Organization         Address  Phone  Notes  [facility]  [phone]   Domenic Schwab, PhD [address] Arlis [place], [place]   [phone] or [phone]   [place] Lucien [place], [place] [phone]   [facility] [address], [place], [place] [phone] Insurance/Medicaid/sponsorship through [facility] and Families [address], [address] [place]                                    [place], [place] [phone] [place] [address], [place] [phone]    Dr. Adele Schilder  [phone]   [facility] [place] Dept. 1) [address] [place] 2) [place] 3)  [place], [place] [phone]  [phone]   [place] [phone] or [phone] (After Hours)

## 2014-11-05 NOTE — ED Notes (Signed)
Pt aware of the need for a urine sample. 

## 2014-11-05 NOTE — ED Notes (Signed)
Pt from home c/o right sided flank pain and abdominal pain x 1 month that has gotten worse. One episode of vomiting prior to arrival. Denies Dysuria

## 2015-01-17 ENCOUNTER — Encounter (HOSPITAL_COMMUNITY): Payer: Self-pay | Admitting: Emergency Medicine

## 2015-01-17 ENCOUNTER — Emergency Department (HOSPITAL_COMMUNITY): Payer: Self-pay

## 2015-01-17 ENCOUNTER — Emergency Department (HOSPITAL_COMMUNITY)
Admission: EM | Admit: 2015-01-17 | Discharge: 2015-01-17 | Disposition: A | Discharge status: Home / Self Care | Payer: Self-pay | Attending: Emergency Medicine | Admitting: Emergency Medicine

## 2015-01-17 DIAGNOSIS — K529 Noninfective gastroenteritis and colitis, unspecified: Secondary | ICD-10-CM

## 2015-01-17 DIAGNOSIS — Z791 Long term (current) use of non-steroidal anti-inflammatories (NSAID): Secondary | ICD-10-CM | POA: Insufficient documentation

## 2015-01-17 DIAGNOSIS — Z720 Tobacco use: Secondary | ICD-10-CM | POA: Insufficient documentation

## 2015-01-17 DIAGNOSIS — Z79899 Other long term (current) drug therapy: Secondary | ICD-10-CM | POA: Insufficient documentation

## 2015-01-17 DIAGNOSIS — Z3202 Encounter for pregnancy test, result negative: Secondary | ICD-10-CM | POA: Insufficient documentation

## 2015-01-17 DIAGNOSIS — R109 Unspecified abdominal pain: Secondary | ICD-10-CM

## 2015-01-17 DIAGNOSIS — R1011 Right upper quadrant pain: Secondary | ICD-10-CM

## 2015-01-17 LAB — LIPASE, BLOOD: LIPASE: 17 U/L (ref 11–59)

## 2015-01-17 LAB — PREGNANCY, URINE: Preg Test, Ur: NEGATIVE

## 2015-01-17 LAB — URINALYSIS, ROUTINE W REFLEX MICROSCOPIC
Bilirubin Urine: NEGATIVE
GLUCOSE, UA: NEGATIVE mg/dL
Hgb urine dipstick: NEGATIVE
KETONES UR: 40 mg/dL — AB
LEUKOCYTES UA: NEGATIVE
NITRITE: NEGATIVE
Protein, ur: NEGATIVE mg/dL
Specific Gravity, Urine: 1.025 (ref 1.005–1.030)
Urobilinogen, UA: 0.2 mg/dL (ref 0.0–1.0)
pH: 6.5 (ref 5.0–8.0)

## 2015-01-17 LAB — CBC WITH DIFFERENTIAL/PLATELET
BASOS ABS: 0 10*3/uL (ref 0.0–0.1)
Basophils Relative: 0 % (ref 0–1)
EOS ABS: 0 10*3/uL (ref 0.0–0.7)
Eosinophils Relative: 0 % (ref 0–5)
HEMATOCRIT: 39.3 % (ref 36.0–46.0)
Hemoglobin: 12.7 g/dL (ref 12.0–15.0)
LYMPHS ABS: 2.5 10*3/uL (ref 0.7–4.0)
Lymphocytes Relative: 27 % (ref 12–46)
MCH: 25 pg — AB (ref 26.0–34.0)
MCHC: 32.3 g/dL (ref 30.0–36.0)
MCV: 77.2 fL — ABNORMAL LOW (ref 78.0–100.0)
MONOS PCT: 6 % (ref 3–12)
Monocytes Absolute: 0.6 10*3/uL (ref 0.1–1.0)
NEUTROS ABS: 6.3 10*3/uL (ref 1.7–7.7)
Neutrophils Relative %: 67 % (ref 43–77)
Platelets: 312 10*3/uL (ref 150–400)
RBC: 5.09 MIL/uL (ref 3.87–5.11)
RDW: 14.3 % (ref 11.5–15.5)
WBC: 9.4 10*3/uL (ref 4.0–10.5)

## 2015-01-17 LAB — COMPREHENSIVE METABOLIC PANEL
ALK PHOS: 98 U/L (ref 39–117)
ALT: 18 U/L (ref 0–35)
AST: 21 U/L (ref 0–37)
Albumin: 4 g/dL (ref 3.5–5.2)
Anion gap: 10 (ref 5–15)
BUN: 7 mg/dL (ref 6–23)
CO2: 23 mmol/L (ref 19–32)
Calcium: 9.1 mg/dL (ref 8.4–10.5)
Chloride: 104 mmol/L (ref 96–112)
Creatinine, Ser: 0.66 mg/dL (ref 0.50–1.10)
GFR calc Af Amer: >90 mL/min (ref >90)
GLUCOSE: 118 mg/dL — AB (ref 70–99)
POTASSIUM: 3.5 mmol/L (ref 3.5–5.1)
SODIUM: 137 mmol/L (ref 135–145)
TOTAL PROTEIN: 7.8 g/dL (ref 6.0–8.3)
Total Bilirubin: 0.8 mg/dL (ref 0.3–1.2)

## 2015-01-17 MED ORDER — ONDANSETRON 4 MG PO TBDP
4.0000 mg | ORAL_TABLET | Freq: Once | ORAL | Status: AC
  Administered 2015-01-17: 4 mg via ORAL
  Filled 2015-01-17: qty 1

## 2015-01-17 MED ORDER — ONDANSETRON HCL 4 MG/2ML IJ SOLN
4.0000 mg | Freq: Once | INTRAMUSCULAR | Status: AC
  Administered 2015-01-17: 4 mg via INTRAVENOUS
  Filled 2015-01-17: qty 2

## 2015-01-17 MED ORDER — MORPHINE SULFATE 4 MG/ML IJ SOLN
4.0000 mg | Freq: Once | INTRAMUSCULAR | Status: AC
Start: 2015-01-17 — End: 2015-01-17
  Administered 2015-01-17: 4 mg via INTRAVENOUS
  Filled 2015-01-17: qty 1

## 2015-01-17 MED ORDER — ONDANSETRON HCL 8 MG PO TABS: 8.0000 mg | ORAL_TABLET | ORAL | Status: DC | PRN

## 2015-01-17 MED ORDER — IOHEXOL 300 MG/ML  SOLN
100.0000 mL | Freq: Once | INTRAMUSCULAR | Status: AC | PRN
  Administered 2015-01-17: 100 mL via INTRAVENOUS

## 2015-01-17 MED ORDER — PROMETHAZINE HCL 25 MG/ML IJ SOLN
12.5000 mg | Freq: Once | INTRAMUSCULAR | Status: AC
  Administered 2015-01-17: 12.5 mg via INTRAVENOUS
  Filled 2015-01-17: qty 1

## 2015-01-17 MED ORDER — SODIUM CHLORIDE 0.9 % IV BOLUS (SEPSIS)
1000.0000 mL | Freq: Once | INTRAVENOUS | Status: AC
  Administered 2015-01-17: 1000 mL via INTRAVENOUS

## 2015-01-17 MED ORDER — IOHEXOL 300 MG/ML  SOLN
25.0000 mL | Freq: Once | INTRAMUSCULAR | Status: AC | PRN
  Administered 2015-01-17: 25 mL via ORAL

## 2015-01-17 MED ORDER — HYDROCODONE-ACETAMINOPHEN 5-325 MG PO TABS: 1.0000 | ORAL_TABLET | Freq: Four times a day (QID) | ORAL | Status: DC | PRN

## 2015-01-17 MED ORDER — KETOROLAC TROMETHAMINE 30 MG/ML IJ SOLN
30.0000 mg | Freq: Once | INTRAMUSCULAR | Status: AC
Start: 2015-01-17 — End: 2015-01-17
  Administered 2015-01-17: 30 mg via INTRAVENOUS
  Filled 2015-01-17: qty 1

## 2015-01-17 NOTE — ED Notes (Signed)
Drank 8 beers on Sunday night and have been vomiting and having right side abdomen pain since.  Vomited about 10 times in las 24 hours.  Denies seeing any blood.  Not able to keep food down.

## 2015-01-17 NOTE — ED Provider Notes (Signed)
CSN: [id number]     Arrival date & time 01/17/15  1034 History  This chart was scribe for Veryl Speak, MD by Judithann Sauger, ED Scribe. The patient was seen in room APA04/APA04 and the patient's care was started at 1:09 PM.    Chief Complaint  Patient presents with  . Abdominal Pain   The history is provided by the patient. No language interpreter was used.   HPI Comments: Sarah Rosales is a 34 y.o. female with a hx of fibroids who presents to the Emergency Department complaining of abdominal pain onset 2 days ago after drinking more than usual. She reports associated diarrhea and vomiting. She explains that she is unable to keep any of her food or drink down. She denies any sick contacts. She reports that she was seen at [facility] 3 months ago and she was given a follow up but she does not have any insurance. She denies any past surgery on her abdomen. She states that her last menstrual cycle was heavier than usual and had more blood clots in it.   History reviewed. No pertinent past medical history. History reviewed. No pertinent past surgical history. Family History  Problem Relation Age of Onset  . Hypertension Other   . Cancer Other    History  Substance Use Topics  . Smoking status: Current Every Day Smoker    Types: Cigarettes  . Smokeless tobacco: Not on file  . Alcohol Use: No     Comment: rarely   OB History    No data available     Review of Systems  Constitutional: Negative for fever.  Gastrointestinal: Positive for vomiting, abdominal pain and diarrhea.  All other systems reviewed and are negative.     Allergies  Review of patient's allergies indicates no known allergies.  Home Medications   Prior to Admission medications   Medication Sig Start Date End Date Taking? Authorizing Provider  bismuth subsalicylate (PEPTO BISMOL) 262 MG/15ML suspension Take 30 mLs by mouth every 6 (six) hours as needed (upset stomach).   Yes Historical Provider, MD   naproxen sodium (ANAPROX) 220 MG tablet Take 220 mg by mouth 2 (two) times daily as needed (pain).   Yes Historical Provider, MD  ondansetron (ZOFRAN) 4 MG tablet Take 1 tablet (4 mg total) by mouth every 6 (six) hours. Patient not taking: Reported on 01/17/2015 11/05/14   Domenic Moras, PA-C  traMADol (ULTRAM) 50 MG tablet Take 1 tablet (50 mg total) by mouth every 6 (six) hours as needed. Patient not taking: Reported on 11/05/2014 09/01/14   Noland Fordyce, PA-C   BP 157/94 mmHg  Pulse 63  Temp(Src) 98.1 F (36.7 C) (Oral)  Resp 18  Ht 5\' 4"  (1.626 m)  Wt 180 lb (81.647 kg)  BMI 30.88 kg/m2  SpO2 100%  LMP 01/13/2015 Physical Exam  Constitutional: She is oriented to person, place, and time. She appears well-developed and well-nourished. No distress.  HENT:  Head: Normocephalic and atraumatic.  Eyes: Conjunctivae and EOM are normal.  Neck: Neck supple. No tracheal deviation present.  Cardiovascular: Normal rate.   Pulmonary/Chest: Effort normal. No respiratory distress.  Abdominal: There is tenderness. There is no rebound and no guarding.  TTP RUQ  Musculoskeletal: Normal range of motion.  Neurological: She is alert and oriented to person, place, and time.  Skin: Skin is warm and dry.  Psychiatric: She has a normal mood and affect. Her behavior is normal.  Nursing note and vitals reviewed.   ED  Course  Procedures (including critical care time) DIAGNOSTIC STUDIES: Oxygen Saturation is 100% on RA, normal by my interpretation.    COORDINATION OF CARE: 1:16 PM- Pt advised of plan for treatment and pt agrees.    Labs Review Labs Reviewed  CBC WITH DIFFERENTIAL/PLATELET - Abnormal; Notable for the following:    MCV 77.2 (*)    MCH 25.0 (*)    All other components within normal limits  COMPREHENSIVE METABOLIC PANEL - Abnormal; Notable for the following:    Glucose, Bld 118 (*)    All other components within normal limits  LIPASE, BLOOD    Imaging Review No results found.    EKG Interpretation None      MDM   Final diagnoses:  None    Patient is a 34 year old female who presents with abdominal cramping, nausea, vomiting, diarrhea for the past 2 days. This started after drinking several alcoholic beverages the evening before. She appears uncomfortable, however her symptomatology is out of proportion with physical exam and results of her workup. There is no white count, electrolytes are essentially normal, and liver and pancreas enzymes are normal. Her CT scan reveals no acute process. She will be discharged to home with medications for pain and nausea and advised to follow-up with gastroenterology.  I personally performed the services described in this documentation, which was scribed in my presence. The recorded information has been reviewed and is accurate.       Veryl Speak, MD 01/17/15 [phone]

## 2015-01-17 NOTE — ED Notes (Signed)
MD at bedside. 

## 2015-01-17 NOTE — ED Notes (Signed)
Patient with no complaints at this time. Respirations even and unlabored. Skin warm/dry. Discharge instructions reviewed with patient at this time. Patient given opportunity to voice concerns/ask questions. IV removed per policy and band-aid applied to site. Patient discharged at this time and left Emergency Department with steady gait.  

## 2015-01-17 NOTE — Discharge Instructions (Signed)
Hydrocodone as prescribed as needed for pain.  Zofran as prescribed as needed for nausea.  Follow-up with Dr. Oneida Alar in the gastroenterology clinic. The contact information has been provided in this discharge summary. Call to arrange the appointment.   Abdominal Pain, Women Abdominal (stomach, pelvic, or belly) pain can be caused by many things. It is important to tell your doctor:  The location of the pain.  Does it come and go or is it present all the time?  Are there things that start the pain (eating certain foods, exercise)?  Are there other symptoms associated with the pain (fever, nausea, vomiting, diarrhea)? All of this is helpful to know when trying to find the cause of the pain. CAUSES   Stomach: virus or bacteria infection, or ulcer.  Intestine: appendicitis (inflamed appendix), regional ileitis (Crohn's disease), ulcerative colitis (inflamed colon), irritable bowel syndrome, diverticulitis (inflamed diverticulum of the colon), or cancer of the stomach or intestine.  Gallbladder disease or stones in the gallbladder.  Kidney disease, kidney stones, or infection.  Pancreas infection or cancer.  Fibromyalgia (pain disorder).  Diseases of the female organs:  Uterus: fibroid (non-cancerous) tumors or infection.  Fallopian tubes: infection or tubal pregnancy.  Ovary: cysts or tumors.  Pelvic adhesions (scar tissue).  Endometriosis (uterus lining tissue growing in the pelvis and on the pelvic organs).  Pelvic congestion syndrome (female organs filling up with blood just before the menstrual period).  Pain with the menstrual period.  Pain with ovulation (producing an egg).  Pain with an IUD (intrauterine device, birth control) in the uterus.  Cancer of the female organs.  Functional pain (pain not caused by a disease, may improve without treatment).  Psychological pain.  Depression. DIAGNOSIS  Your doctor will decide the seriousness of your pain by doing  an examination.  Blood tests.  X-rays.  Ultrasound.  CT scan (computed tomography, special type of X-ray).  MRI (magnetic resonance imaging).  Cultures, for infection.  Barium enema (dye inserted in the large intestine, to better view it with X-rays).  Colonoscopy (looking in intestine with a lighted tube).  Laparoscopy (minor surgery, looking in abdomen with a lighted tube).  Major abdominal exploratory surgery (looking in abdomen with a large incision). TREATMENT  The treatment will depend on the cause of the pain.   Many cases can be observed and treated at home.  Over-the-counter medicines recommended by your caregiver.  Prescription medicine.  Antibiotics, for infection.  Birth control pills, for painful periods or for ovulation pain.  Hormone treatment, for endometriosis.  Nerve blocking injections.  Physical therapy.  Antidepressants.  Counseling with a psychologist or psychiatrist.  Minor or major surgery. HOME CARE INSTRUCTIONS   Do not take laxatives, unless directed by your caregiver.  Take over-the-counter pain medicine only if ordered by your caregiver. Do not take aspirin because it can cause an upset stomach or bleeding.  Try a clear liquid diet (broth or water) as ordered by your caregiver. Slowly move to a bland diet, as tolerated, if the pain is related to the stomach or intestine.  Have a thermometer and take your temperature several times a day, and record it.  Bed rest and sleep, if it helps the pain.  Avoid sexual intercourse, if it causes pain.  Avoid stressful situations.  Keep your follow-up appointments and tests, as your caregiver orders.  If the pain does not go away with medicine or surgery, you may try:  Acupuncture.  Relaxation exercises (yoga, meditation).  Group therapy.  Counseling. SEEK MEDICAL CARE IF:   You notice certain foods cause stomach pain.  Your home care treatment is not helping your pain.  You  need stronger pain medicine.  You want your IUD removed.  You feel faint or lightheaded.  You develop nausea and vomiting.  You develop a rash.  You are having side effects or an allergy to your medicine. SEEK IMMEDIATE MEDICAL CARE IF:   Your pain does not go away or gets worse.  You have a fever.  Your pain is felt only in portions of the abdomen. The right side could possibly be appendicitis. The left lower portion of the abdomen could be colitis or diverticulitis.  You are passing blood in your stools (bright red or black tarry stools, with or without vomiting).  You have blood in your urine.  You develop chills, with or without a fever.  You pass out. MAKE SURE YOU:   Understand these instructions.  Will watch your condition.  Will get help right away if you are not doing well or get worse. Document Released: 08/18/2007 Document Revised: 03/07/2014 Document Reviewed: 09/07/2009 [facility] Patient Information 2015 [place], [place]. This information is not intended to replace advice given to you by your health care provider. Make sure you discuss any questions you have with your health care provider.  Viral Gastroenteritis Viral gastroenteritis is also known as stomach flu. This condition affects the stomach and intestinal tract. It can cause sudden diarrhea and vomiting. The illness typically lasts 3 to 8 days. Most people develop an immune response that eventually gets rid of the virus. While this natural response develops, the virus can make you quite ill. CAUSES  Many different viruses can cause gastroenteritis, such as rotavirus or noroviruses. You can catch one of these viruses by consuming contaminated food or water. You may also catch a virus by sharing utensils or other personal items with an infected person or by touching a contaminated surface. SYMPTOMS  The most common symptoms are diarrhea and vomiting. These problems can cause a severe loss of body fluids  (dehydration) and a body salt (electrolyte) imbalance. Other symptoms may include:  Fever.  Headache.  Fatigue.  Abdominal pain. DIAGNOSIS  Your caregiver can usually diagnose viral gastroenteritis based on your symptoms and a physical exam. A stool sample may also be taken to test for the presence of viruses or other infections. TREATMENT  This illness typically goes away on its own. Treatments are aimed at rehydration. The most serious cases of viral gastroenteritis involve vomiting so severely that you are not able to keep fluids down. In these cases, fluids must be given through an intravenous line (IV). HOME CARE INSTRUCTIONS   Drink enough fluids to keep your urine clear or pale yellow. Drink small amounts of fluids frequently and increase the amounts as tolerated.  Ask your caregiver for specific rehydration instructions.  Avoid:  Foods high in sugar.  Alcohol.  Carbonated drinks.  Tobacco.  Juice.  Caffeine drinks.  Extremely hot or cold fluids.  Fatty, greasy foods.  Too much intake of anything at one time.  Dairy products until 24 to 48 hours after diarrhea stops.  You may consume probiotics. Probiotics are active cultures of beneficial bacteria. They may lessen the amount and number of diarrheal stools in adults. Probiotics can be found in yogurt with active cultures and in supplements.  Wash your hands well to avoid spreading the virus.  Only take over-the-counter or prescription medicines for pain, discomfort, or fever  as directed by your caregiver. Do not give aspirin to children. Antidiarrheal medicines are not recommended.  Ask your caregiver if you should continue to take your regular prescribed and over-the-counter medicines.  Keep all follow-up appointments as directed by your caregiver. SEEK IMMEDIATE MEDICAL CARE IF:   You are unable to keep fluids down.  You do not urinate at least once every 6 to 8 hours.  You develop shortness of  breath.  You notice blood in your stool or vomit. This may look like coffee grounds.  You have abdominal pain that increases or is concentrated in one small area (localized).  You have persistent vomiting or diarrhea.  You have a fever.  The patient is a child younger than 3 months, and he or she has a fever.  The patient is a child older than 3 months, and he or she has a fever and persistent symptoms.  The patient is a child older than 3 months, and he or she has a fever and symptoms suddenly get worse.  The patient is a baby, and he or she has no tears when crying. MAKE SURE YOU:   Understand these instructions.  Will watch your condition.  Will get help right away if you are not doing well or get worse. Document Released: 10/21/2005 Document Revised: 01/13/2012 Document Reviewed: 08/07/2011 [facility] Patient Information 2015 [place], [place]. This information is not intended to replace advice given to you by your health care provider. Make sure you discuss any questions you have with your health care provider.

## 2015-11-02 ENCOUNTER — Emergency Department (HOSPITAL_COMMUNITY): Payer: Self-pay

## 2015-11-02 ENCOUNTER — Emergency Department (HOSPITAL_COMMUNITY)
Admission: EM | Admit: 2015-11-02 | Discharge: 2015-11-02 | Disposition: A | Discharge status: Home / Self Care | Payer: Self-pay | Attending: Emergency Medicine | Admitting: Emergency Medicine

## 2015-11-02 ENCOUNTER — Encounter (HOSPITAL_COMMUNITY): Payer: Self-pay | Admitting: Emergency Medicine

## 2015-11-02 DIAGNOSIS — J40 Bronchitis, not specified as acute or chronic: Secondary | ICD-10-CM | POA: Insufficient documentation

## 2015-11-02 DIAGNOSIS — J029 Acute pharyngitis, unspecified: Secondary | ICD-10-CM | POA: Insufficient documentation

## 2015-11-02 DIAGNOSIS — Z87891 Personal history of nicotine dependence: Secondary | ICD-10-CM | POA: Insufficient documentation

## 2015-11-02 DIAGNOSIS — R0981 Nasal congestion: Secondary | ICD-10-CM | POA: Insufficient documentation

## 2015-11-02 MED ORDER — AZITHROMYCIN 250 MG PO TABS: 250.0000 mg | ORAL_TABLET | Freq: Every day | ORAL | Status: DC

## 2015-11-02 NOTE — ED Provider Notes (Signed)
CSN: [id number]     Arrival date & time 11/02/15  1026 History   First MD Initiated Contact with Patient 11/02/15 1107     Chief Complaint  Patient presents with  . Cough     (Consider location/radiation/quality/duration/timing/severity/associated sxs/prior Treatment) Patient is a 34 y.o. female presenting with cough. The history is provided by the patient. No language interpreter was used.  Cough Cough characteristics:  Productive Sputum characteristics:  Nondescript Severity:  Moderate Onset quality:  Gradual Duration:  4 days Timing:  Constant Progression:  Partially resolved Chronicity:  New Smoker: no   Relieved by:  Nothing Worsened by:  Nothing tried Ineffective treatments:  None tried Associated symptoms: sinus congestion and sore throat     History reviewed. No pertinent past medical history. History reviewed. No pertinent past surgical history. Family History  Problem Relation Age of Onset  . Hypertension Other   . Cancer Other    Social History  Substance Use Topics  . Smoking status: Former Smoker    Types: Cigarettes    Quit date: 07/20/2015  . Smokeless tobacco: None  . Alcohol Use: No     Comment: rarely   OB History    No data available     Review of Systems  HENT: Positive for sore throat.   Respiratory: Positive for cough.   All other systems reviewed and are negative.     Allergies  Review of patient's allergies indicates no known allergies.  Home Medications   Prior to Admission medications   Not on File   BP 132/84 mmHg  Pulse 64  Temp(Src) 97.9 F (36.6 C)  Resp 20  Ht 5\' 3"  (1.6 m)  Wt 78.472 kg  BMI 30.65 kg/m2  SpO2 100%  LMP 10/22/2015 Physical Exam  Constitutional: She is oriented to person, place, and time. She appears well-developed and well-nourished.  HENT:  Head: Normocephalic and atraumatic.  Eyes: Conjunctivae and EOM are normal. Pupils are equal, round, and reactive to light.  Neck: Normal range of  motion.  Cardiovascular: Normal rate and normal heart sounds.   Pulmonary/Chest: Effort normal.  Abdominal: Soft. She exhibits no distension.  Musculoskeletal: Normal range of motion.  Neurological: She is alert and oriented to person, place, and time.  Skin: Skin is warm.  Psychiatric: She has a normal mood and affect.  Nursing note and vitals reviewed.   ED Course  Procedures (including critical care time) Labs Review Labs Reviewed - No data to display  Imaging Review Dg Chest 2 View  11/02/2015  CLINICAL DATA:  34 year old with 3 day history of productive cough, chest congestion and fever. EXAM: CHEST  2 VIEW COMPARISON:  None. FINDINGS: Cardiomediastinal silhouette unremarkable. Minimal or mild central peribronchial thickening. Lungs otherwise clear. No localized airspace consolidation. No pleural effusions. No pneumothorax. Normal pulmonary vascularity. Visualized bony thorax intact. IMPRESSION: Minimal mild changes of bronchitis and/or asthma without focal airspace pneumonia. Electronically Signed   By: Evangeline Dakin M.D.   On: 11/02/2015 11:54   I have personally reviewed and evaluated these images and lab results as part of my medical decision-making.   EKG Interpretation None      MDM   Final diagnoses:  Bronchitis    zithromax Tylenol     Fransico Meadow, PA-C 11/02/15 1521  Merrily Pew, MD 11/02/15 [phone]

## 2015-11-02 NOTE — Discharge Instructions (Signed)

## 2015-11-02 NOTE — ED Notes (Addendum)
PT c/o productive yellow/brown sputum cough, nasal congestion, and generalized bodyaches x4 days. PT denies any medication use this am.

## 2015-12-05 DIAGNOSIS — Z139 Encounter for screening, unspecified: Secondary | ICD-10-CM

## 2015-12-06 ENCOUNTER — Other Ambulatory Visit: Payer: Self-pay | Admitting: Physician Assistant

## 2015-12-06 ENCOUNTER — Ambulatory Visit: Payer: Self-pay | Admitting: Physician Assistant

## 2015-12-06 ENCOUNTER — Encounter: Payer: Self-pay | Admitting: Physician Assistant

## 2015-12-06 VITALS — BP 146/90 | HR 92 | Temp 97.5°F | Ht 62.0 in | Wt 178.8 lb

## 2015-12-06 DIAGNOSIS — R109 Unspecified abdominal pain: Secondary | ICD-10-CM

## 2015-12-06 DIAGNOSIS — E669 Obesity, unspecified: Secondary | ICD-10-CM

## 2015-12-06 DIAGNOSIS — F419 Anxiety disorder, unspecified: Secondary | ICD-10-CM

## 2015-12-06 DIAGNOSIS — Z13220 Encounter for screening for lipoid disorders: Secondary | ICD-10-CM

## 2015-12-06 DIAGNOSIS — Z131 Encounter for screening for diabetes mellitus: Secondary | ICD-10-CM

## 2015-12-06 DIAGNOSIS — I10 Essential (primary) hypertension: Secondary | ICD-10-CM

## 2015-12-06 DIAGNOSIS — R300 Dysuria: Secondary | ICD-10-CM

## 2015-12-06 LAB — GLUCOSE, POCT (MANUAL RESULT ENTRY): POC Glucose: 96 mg/dl (ref 70–99)

## 2015-12-06 LAB — CBC WITH DIFFERENTIAL/PLATELET
Basophils Absolute: 0 10*3/uL (ref 0.0–0.1)
Basophils Relative: 0 % (ref 0–1)
Eosinophils Absolute: 0.1 10*3/uL (ref 0.0–0.7)
Eosinophils Relative: 1 % (ref 0–5)
HCT: 37.1 % (ref 36.0–46.0)
Hemoglobin: 11.8 g/dL — ABNORMAL LOW (ref 12.0–15.0)
Lymphocytes Relative: 33 % (ref 12–46)
Lymphs Abs: 2.8 10*3/uL (ref 0.7–4.0)
MCH: 23.5 pg — ABNORMAL LOW (ref 26.0–34.0)
MCHC: 31.8 g/dL (ref 30.0–36.0)
MCV: 73.9 fL — ABNORMAL LOW (ref 78.0–100.0)
MPV: 9.9 fL (ref 8.6–12.4)
Monocytes Absolute: 0.5 10*3/uL (ref 0.1–1.0)
Monocytes Relative: 6 % (ref 3–12)
NEUTROS ABS: 5 10*3/uL (ref 1.7–7.7)
NEUTROS PCT: 60 % (ref 43–77)
Platelets: 298 10*3/uL (ref 150–400)
RBC: 5.02 MIL/uL (ref 3.87–5.11)
RDW: 16.4 % — ABNORMAL HIGH (ref 11.5–15.5)
WBC: 8.4 10*3/uL (ref 4.0–10.5)

## 2015-12-06 LAB — COMPLETE METABOLIC PANEL WITH GFR
ALK PHOS: 74 U/L (ref 33–115)
ALT: 11 U/L (ref 6–29)
AST: 16 U/L (ref 10–30)
Albumin: 4 g/dL (ref 3.6–5.1)
BUN: 11 mg/dL (ref 7–25)
CALCIUM: 9.2 mg/dL (ref 8.6–10.2)
CO2: 21 mmol/L (ref 20–31)
Chloride: 106 mmol/L (ref 98–110)
Creat: 0.87 mg/dL (ref 0.50–1.10)
GFR, EST NON AFRICAN AMERICAN: 87 mL/min (ref >=60)
GFR, Est African American: >89 mL/min (ref >=60)
GLUCOSE: 80 mg/dL (ref 65–99)
POTASSIUM: 5.4 mmol/L — AB (ref 3.5–5.3)
SODIUM: 138 mmol/L (ref 135–146)
Total Bilirubin: 0.5 mg/dL (ref 0.2–1.2)
Total Protein: 6.9 g/dL (ref 6.1–8.1)

## 2015-12-06 LAB — POCT URINALYSIS DIPSTICK
Bilirubin, UA: NEGATIVE
Blood, UA: NEGATIVE
Glucose, UA: NEGATIVE
Ketones, UA: NEGATIVE
Nitrite, UA: NEGATIVE
PROTEIN UA: NEGATIVE
Spec Grav, UA: 1.02
Urobilinogen, UA: 0.2
pH, UA: 5

## 2015-12-06 LAB — HEMOGLOBIN A1C
Hgb A1c MFr Bld: 5.6 % (ref <5.7)
Mean Plasma Glucose: 114 mg/dL (ref <117)

## 2015-12-06 LAB — LIPID PANEL
CHOL/HDL RATIO: 2.9 ratio (ref <=5.0)
Cholesterol: 220 mg/dL — ABNORMAL HIGH (ref 125–200)
HDL: 75 mg/dL (ref >=46)
LDL Cholesterol: 135 mg/dL — ABNORMAL HIGH (ref <130)
Triglycerides: 50 mg/dL (ref <150)
VLDL: 10 mg/dL (ref <30)

## 2015-12-06 LAB — TSH: TSH: 2.516 u[IU]/mL (ref 0.350–4.500)

## 2015-12-06 MED ORDER — LISINOPRIL 10 MG PO TABS: 10.0000 mg | ORAL_TABLET | Freq: Every day | ORAL | Status: DC

## 2015-12-06 MED ORDER — RANITIDINE HCL 300 MG PO TABS: 300.0000 mg | ORAL_TABLET | Freq: Every day | ORAL | Status: DC

## 2015-12-06 NOTE — Patient Instructions (Addendum)
Get blood drawn Return stool for testing Sign up for PAP clinic Start on lisinopril for blood pressure Ranitidine for stomach Compound W for wart

## 2015-12-06 NOTE — Progress Notes (Signed)
BP 146/90 mmHg  Pulse 92  Temp(Src) 97.5 F (36.4 C)  Ht 5\' 2"  (1.575 m)  Wt 178 lb 12.8 oz (81.103 kg)  BMI 32.69 kg/m2  SpO2 99%  LMP 11/22/2015   Subjective:    Patient ID: Sarah Rosales, female    DOB: 06-Oct-1981, 35 y.o.   MRN: [id number]  HPI: Sarah Rosales is a 35 y.o. female presenting on 12/06/2015 for New Patient (Initial Visit)   HPI   Pt has not been on meds for bp in about 4 years  Pt states menses regular.  Last pap- about 5 or 6 years ago.    States anxiety for about 2 months, only when she gets around a lot of people.  She states no anxiety in the past  Pt states abd pain for about a year.   Pt states it comes and goes. Hurts 2-3 times/week. Pain lasts 30-45 minutes.  Feels like it's in knots.  Pt takes nothing for the symptoms.   Hurts in the lower part of the abdomen.   She has emesis usually twice/month.   Pt states hot spells followed by nausea.   No dysuria.   Relevant past medical, surgical, family and social history reviewed and updated as indicated. Interim medical history since our last visit reviewed. Allergies and medications reviewed and updated.  No current outpatient prescriptions on file.   Review of Systems  Constitutional: Positive for chills, diaphoresis and fatigue. Negative for fever, appetite change and unexpected weight change.  HENT: Positive for dental problem. Negative for congestion, drooling, ear pain, facial swelling, hearing loss, mouth sores, sneezing, sore throat, trouble swallowing and voice change.   Eyes: Negative for pain, discharge, redness, itching and visual disturbance.  Respiratory: Negative for cough, choking, shortness of breath and wheezing.   Cardiovascular: Negative for chest pain, palpitations and leg swelling.  Gastrointestinal: Positive for vomiting and abdominal pain. Negative for diarrhea, constipation and blood in stool.  Endocrine: Negative for cold intolerance, heat intolerance and polydipsia.   Genitourinary: Negative for dysuria, hematuria and decreased urine volume.  Musculoskeletal: Negative for back pain, arthralgias and gait problem.  Skin: Negative for rash.  Allergic/Immunologic: Negative for environmental allergies.  Neurological: Negative for seizures, syncope, light-headedness and headaches.  Hematological: Negative for adenopathy.  Psychiatric/Behavioral: Negative for suicidal ideas, dysphoric mood and agitation. The patient is nervous/anxious.     Per HPI unless specifically indicated above     Objective:    BP 146/90 mmHg  Pulse 92  Temp(Src) 97.5 F (36.4 C)  Ht 5\' 2"  (1.575 m)  Wt 178 lb 12.8 oz (81.103 kg)  BMI 32.69 kg/m2  SpO2 99%  LMP 11/22/2015  Wt Readings from Last 3 Encounters:  12/06/15 178 lb 12.8 oz (81.103 kg)  11/02/15 173 lb (78.472 kg)  01/17/15 180 lb (81.647 kg)    Physical Exam  Constitutional: She is oriented to person, place, and time. She appears well-developed and well-nourished.  HENT:  Head: Normocephalic and atraumatic.  Mouth/Throat: Oropharynx is clear and moist. No oropharyngeal exudate.  Eyes: Conjunctivae and EOM are normal. Pupils are equal, round, and reactive to light.  Neck: Neck supple. No thyromegaly present.  Cardiovascular: Normal rate and regular rhythm.   Pulmonary/Chest: Effort normal and breath sounds normal.  Abdominal: Soft. Bowel sounds are normal. She exhibits no mass. There is no hepatosplenomegaly. There is no tenderness.  Musculoskeletal: She exhibits no edema.  Lymphadenopathy:    She has no cervical adenopathy.  Neurological: She  is alert and oriented to person, place, and time. Gait normal.  Skin: Skin is warm and dry.  Psychiatric: She has a normal mood and affect. Her behavior is normal.  Vitals reviewed.   Results for orders placed or performed in visit on 12/06/15  POCT Glucose (CBG)  Result Value Ref Range   POC Glucose 96 70 - 99 mg/dl      Assessment & Plan:   Encounter  Diagnoses  Name Primary?  . Essential hypertension Yes  . Abdominal pain, unspecified abdominal location   . Anxiety   . Dysuria   . Screening for diabetes mellitus   . Screening cholesterol level   . Obesity, unspecified     -pt to  Ask her  mother what kind of CA her father had. -get Baseline labs -gave pt iFOBT test to [facility] -recommended to pt that she go to free PAP clinic at [facility] this month -rx Lisinopril for bp -f/u 1 mo.  RTO sooner prn worsening or new symptoms

## 2015-12-07 ENCOUNTER — Other Ambulatory Visit: Payer: Self-pay | Admitting: Physician Assistant

## 2015-12-07 DIAGNOSIS — E669 Obesity, unspecified: Secondary | ICD-10-CM | POA: Insufficient documentation

## 2015-12-07 DIAGNOSIS — E875 Hyperkalemia: Secondary | ICD-10-CM

## 2015-12-07 DIAGNOSIS — F419 Anxiety disorder, unspecified: Secondary | ICD-10-CM | POA: Insufficient documentation

## 2015-12-10 LAB — URINE CULTURE

## 2015-12-14 LAB — POTASSIUM: Potassium: 4.4 mmol/L (ref 3.5–5.3)

## 2015-12-14 LAB — IFOBT (OCCULT BLOOD): IFOBT: NEGATIVE

## 2015-12-17 ENCOUNTER — Other Ambulatory Visit: Payer: Self-pay | Admitting: Physician Assistant

## 2015-12-17 MED ORDER — CIPROFLOXACIN HCL 500 MG PO TABS: 500.0000 mg | ORAL_TABLET | Freq: Two times a day (BID) | ORAL | Status: AC

## 2016-01-08 ENCOUNTER — Ambulatory Visit: Payer: Self-pay | Admitting: Physician Assistant

## 2016-01-10 ENCOUNTER — Encounter: Payer: Self-pay | Admitting: Physician Assistant

## 2016-02-26 NOTE — Congregational Nurse Program (Signed)
Congregational Nurse Program Note  Date of Encounter: 12/05/2015  Past Medical History: Past Medical History  Diagnosis Date  . Hypertension 2010    diagnosed while incarcerated    Encounter Details:   New client to [facility]. Client currently without PCP. History of Hypertension, has taken HCTZ in the past according to client, but has not taken any in around 2 years . Client lives with friends and is currently receiving food stamps, referral made to [facility] for supplementing food. Client presents with complaint of not having PCP , states she has been having symptoms of dizziness, nausea and increased thirst. States she has a family history of diabetes.States she has been donating plasma for money in [place], every 3 to 4 weeks and they have denied her to donate due to her blood pressure. Client denies headache, decreased strength in upper or lower extremities. She has had episodes of dizziness. She also reports a history of acid reflux, but is not taking anything for it currently.Past surgical history of cervical polyps per client that was removed in prison about 2 years ago. Will refer client into the [facility] due to urgency of need for treatment of HTN. Appoinment made for Feb.1st 2017 at 0830am. Instructed client to call 911 immediately for any numbness , tingling or loss of strength or use of extremities, difficulty speaking or headaches. Client states understanding. Also, discussed importance of decreasing salt in her diet and ways to do that. Will follow up with client after her initial appointment.B/P 180/120 today.

## 2016-02-28 ENCOUNTER — Ambulatory Visit: Payer: Self-pay | Admitting: Physician Assistant

## 2016-03-04 ENCOUNTER — Encounter: Payer: Self-pay | Admitting: Physician Assistant

## 2017-03-03 ENCOUNTER — Emergency Department (HOSPITAL_COMMUNITY)
Admission: EM | Admit: 2017-03-03 | Discharge: 2017-03-03 | Disposition: A | Discharge status: Home / Self Care | Payer: Self-pay | Attending: Emergency Medicine | Admitting: Emergency Medicine

## 2017-03-03 ENCOUNTER — Encounter (HOSPITAL_COMMUNITY): Payer: Self-pay

## 2017-03-03 DIAGNOSIS — R112 Nausea with vomiting, unspecified: Secondary | ICD-10-CM | POA: Insufficient documentation

## 2017-03-03 DIAGNOSIS — R197 Diarrhea, unspecified: Secondary | ICD-10-CM | POA: Insufficient documentation

## 2017-03-03 DIAGNOSIS — I10 Essential (primary) hypertension: Secondary | ICD-10-CM | POA: Insufficient documentation

## 2017-03-03 DIAGNOSIS — Z87891 Personal history of nicotine dependence: Secondary | ICD-10-CM | POA: Insufficient documentation

## 2017-03-03 LAB — CBC
HEMATOCRIT: 38.4 % (ref 36.0–46.0)
Hemoglobin: 12.7 g/dL (ref 12.0–15.0)
MCH: 24.5 pg — ABNORMAL LOW (ref 26.0–34.0)
MCHC: 33.1 g/dL (ref 30.0–36.0)
MCV: 74 fL — ABNORMAL LOW (ref 78.0–100.0)
PLATELETS: 246 10*3/uL (ref 150–400)
RBC: 5.19 MIL/uL — ABNORMAL HIGH (ref 3.87–5.11)
RDW: 15.2 % (ref 11.5–15.5)
WBC: 11 10*3/uL — AB (ref 4.0–10.5)

## 2017-03-03 LAB — COMPREHENSIVE METABOLIC PANEL
ALBUMIN: 3.8 g/dL (ref 3.5–5.0)
ALT: 17 U/L (ref 14–54)
ANION GAP: 7 (ref 5–15)
AST: 22 U/L (ref 15–41)
Alkaline Phosphatase: 78 U/L (ref 38–126)
BUN: 9 mg/dL (ref 6–20)
CALCIUM: 8.9 mg/dL (ref 8.9–10.3)
CO2: 20 mmol/L — ABNORMAL LOW (ref 22–32)
Chloride: 111 mmol/L (ref 101–111)
Creatinine, Ser: 0.66 mg/dL (ref 0.44–1.00)
GFR calc non Af Amer: >60 mL/min (ref >60)
Glucose, Bld: 112 mg/dL — ABNORMAL HIGH (ref 65–99)
POTASSIUM: 4 mmol/L (ref 3.5–5.1)
SODIUM: 138 mmol/L (ref 135–145)
TOTAL PROTEIN: 6.9 g/dL (ref 6.5–8.1)
Total Bilirubin: 1.2 mg/dL (ref 0.3–1.2)

## 2017-03-03 LAB — POC URINE PREG, ED: Preg Test, Ur: NEGATIVE

## 2017-03-03 LAB — URINALYSIS, ROUTINE W REFLEX MICROSCOPIC
BILIRUBIN URINE: NEGATIVE
GLUCOSE, UA: NEGATIVE mg/dL
HGB URINE DIPSTICK: NEGATIVE
Ketones, ur: 80 mg/dL — AB
NITRITE: NEGATIVE
PH: 5 (ref 5.0–8.0)
Protein, ur: NEGATIVE mg/dL
SPECIFIC GRAVITY, URINE: 1.021 (ref 1.005–1.030)

## 2017-03-03 LAB — LIPASE, BLOOD: LIPASE: 19 U/L (ref 11–51)

## 2017-03-03 MED ORDER — ONDANSETRON 4 MG PO TBDP
ORAL_TABLET | ORAL | Status: AC
  Filled 2017-03-03: qty 1

## 2017-03-03 MED ORDER — KETOROLAC TROMETHAMINE 30 MG/ML IJ SOLN
30.0000 mg | Freq: Once | INTRAMUSCULAR | Status: AC
  Administered 2017-03-03: 30 mg via INTRAVENOUS
  Filled 2017-03-03: qty 1

## 2017-03-03 MED ORDER — ONDANSETRON 4 MG PO TBDP: 4.0000 mg | ORAL_TABLET | Freq: Three times a day (TID) | ORAL | 0 refills | Status: DC | PRN

## 2017-03-03 MED ORDER — ONDANSETRON 4 MG PO TBDP
4.0000 mg | ORAL_TABLET | Freq: Once | ORAL | Status: AC | PRN
Start: 2017-03-03 — End: 2017-03-03
  Administered 2017-03-03: 4 mg via ORAL

## 2017-03-03 MED ORDER — ONDANSETRON HCL 4 MG/2ML IJ SOLN
4.0000 mg | Freq: Once | INTRAMUSCULAR | Status: AC
  Administered 2017-03-03: 4 mg via INTRAVENOUS
  Filled 2017-03-03: qty 2

## 2017-03-03 MED ORDER — DICYCLOMINE HCL 20 MG PO TABS: 20.0000 mg | ORAL_TABLET | Freq: Two times a day (BID) | ORAL | 0 refills | Status: DC

## 2017-03-03 MED ORDER — SODIUM CHLORIDE 0.9 % IV BOLUS (SEPSIS)
1000.0000 mL | Freq: Once | INTRAVENOUS | Status: AC
  Administered 2017-03-03: 1000 mL via INTRAVENOUS

## 2017-03-03 MED ORDER — ONDANSETRON 4 MG PO TBDP
4.0000 mg | ORAL_TABLET | Freq: Once | ORAL | Status: AC
  Administered 2017-03-03: 4 mg via ORAL
  Filled 2017-03-03: qty 1

## 2017-03-03 NOTE — ED Triage Notes (Signed)
Per Pt, Pt is coming from home with mid-abdominal pain, nausea, vomiting, and diarrhea that started this morning. Denies being around anyone who has been sick.

## 2017-03-03 NOTE — Discharge Instructions (Signed)
Take the prescribed medication as directed.  Make sure to drink fluids to stay hydrated. Recommend gentle diet for now and progress back to normal as tolerated. Follow-up with your primary care doctor.  If you do not have one, you may see the wellness clinic. Return to the ED for new or worsening symptoms.

## 2017-03-03 NOTE — ED Provider Notes (Signed)
[facility] DEPT Provider Note   CSN: [id number] Arrival date & time: 03/03/17  1256     History   Chief Complaint Chief Complaint  Patient presents with  . Emesis    HPI Sarah Rosales is a 36 y.o. female.  The history is provided by the patient and medical records.    36 year old female with history of hypertension, presenting to the ED for nausea, vomiting, and diarrhea. States this was present upon waking morning. States she did have to attend court this morning, but what she left there she began throwing up in the parking lot. States symptoms have been present ever since. Force multiple episodes of nonbloody, nonbilious emesis as well as loose, watery diarrhea. Last episode was about 1 hour ago in the waiting room. She denies fever but does report intermittent sweats and chills, mostly just before she vomits. She's not had any recent sick contacts. No recent travel or abnormal food intake. No recent antibiotics use. She has tried drinking water and sprite during the day today but is continued vomiting. She was given Zofran in the waiting room which she states helped somewhat but symptoms have returned. She denies any abdominal pain, pelvic pain, urinary symptoms, or vaginal complaints at this time. No prior abdominal surgeries.  Past Medical History:  Diagnosis Date  . Hypertension 2010   diagnosed while incarcerated    Patient Active Problem List   Diagnosis Date Noted  . Anxiety 12/07/2015  . Obesity, unspecified 12/07/2015  . Hypertension     History reviewed. No pertinent surgical history.  OB History    No data available       Home Medications    Prior to Admission medications   Medication Sig Start Date End Date Taking? Authorizing Provider  lisinopril (PRINIVIL,ZESTRIL) 10 MG tablet Take 1 tablet (10 mg total) by mouth daily. 12/06/15   Soyla Dryer, PA-C  ranitidine (ZANTAC) 300 MG tablet Take 1 tablet (300 mg total) by mouth at bedtime. 12/06/15    Soyla Dryer, PA-C    Family History Family History  Problem Relation Age of Onset  . Hypertension Mother   . Cancer Mother     stomach  . Cancer Father   . Diabetes Sister   . COPD Sister   . Irritable bowel syndrome Sister   . Diabetes Brother   . Heart disease Brother     Social History Social History  Substance Use Topics  . Smoking status: Former Smoker    Types: Cigarettes    Quit date: 07/20/2015  . Smokeless tobacco: Never Used  . Alcohol use No     Comment: rarely     Allergies   Patient has no known allergies.   Review of Systems Review of Systems  Gastrointestinal: Positive for diarrhea, nausea and vomiting.  All other systems reviewed and are negative.    Physical Exam Updated Vital Signs BP 129/84 (BP Location: Right Arm)   Pulse 73   Temp 98.5 F (36.9 C) (Oral)   Resp 20   Ht 5\' 2"  (1.575 m)   Wt 73 kg   LMP 02/17/2017 (Approximate)   SpO2 100%   BMI 29.45 kg/m   Physical Exam  Constitutional: She is oriented to person, place, and time. She appears well-developed and well-nourished.  HENT:  Head: Normocephalic and atraumatic.  Mouth/Throat: Oropharynx is clear and moist.  Eyes: Conjunctivae and EOM are normal. Pupils are equal, round, and reactive to light.  Neck: Normal range of motion.  Cardiovascular:  Normal rate, regular rhythm and normal heart sounds.   Pulmonary/Chest: Effort normal and breath sounds normal. No respiratory distress. She has no wheezes.  Abdominal: Soft. Bowel sounds are normal. There is no tenderness. There is no rebound.  No abdomen tenderness or guarding, normal bowel sounds, no distention  Musculoskeletal: Normal range of motion.  Neurological: She is alert and oriented to person, place, and time.  Skin: Skin is warm and dry.  Psychiatric: She has a normal mood and affect.  Nursing note and vitals reviewed.    ED Treatments / Results  Labs (all labs ordered are listed, but only abnormal results are  displayed) Labs Reviewed  COMPREHENSIVE METABOLIC PANEL - Abnormal; Notable for the following:       Result Value   CO2 20 (*)    Glucose, Bld 112 (*)    All other components within normal limits  CBC - Abnormal; Notable for the following:    WBC 11.0 (*)    RBC 5.19 (*)    MCV 74.0 (*)    MCH 24.5 (*)    All other components within normal limits  LIPASE, BLOOD  URINALYSIS, ROUTINE W REFLEX MICROSCOPIC  POC URINE PREG, ED    EKG  EKG Interpretation None       Radiology No results found.  Procedures Procedures (including critical care time)  Medications Ordered in ED Medications  ondansetron (ZOFRAN-ODT) 4 MG disintegrating tablet (not administered)  ondansetron (ZOFRAN-ODT) disintegrating tablet 4 mg (4 mg Oral Given 03/03/17 1335)  sodium chloride 0.9 % bolus 1,000 mL (0 mLs Intravenous Stopped 03/03/17 1740)  ondansetron (ZOFRAN) injection 4 mg (4 mg Intravenous Given 03/03/17 1623)  ketorolac (TORADOL) 30 MG/ML injection 30 mg (30 mg Intravenous Given 03/03/17 1624)  ondansetron (ZOFRAN-ODT) disintegrating tablet 4 mg (4 mg Oral Given 03/03/17 2011)     Initial Impression / Assessment and Plan / ED Course  I have reviewed the triage vital signs and the nursing notes.  Pertinent labs & imaging results that were available during my care of the patient were reviewed by me and considered in my medical decision making (see chart for details).  36 year old female here with nausea, vomiting, and diarrhea onset today. She is afebrile and nontoxic. Her abdomen is soft and benign. Screening lab work obtained from triage is overall reassuring. There are ketones in the urine concerning for dehydration. Patient was treated here with IV fluids, Zofran, and Toradol with improvement of symptoms. She is tolerating oral fluids and medications well.  Suspect viral gastroenteritis. VS remain stable.  Feel she is stable for discharge.  Recommended close follow-up, also given information for  wellness clinic. Will discharge home with Zofran and Bentyl.  Discussed plan with patient, she acknowledged understanding and agreed with plan of care.  Return precautions given for new or worsening symptoms.  Final Clinical Impressions(s) / ED Diagnoses   Final diagnoses:  Nausea vomiting and diarrhea    New Prescriptions Discharge Medication List as of 03/03/2017  7:44 PM    START taking these medications   Details  dicyclomine (BENTYL) 20 MG tablet Take 1 tablet (20 mg total) by mouth 2 (two) times daily., Starting Mon 03/03/2017, Print    ondansetron (ZOFRAN ODT) 4 MG disintegrating tablet Take 1 tablet (4 mg total) by mouth every 8 (eight) hours as needed for nausea., Starting Mon 03/03/2017, Print         Larene Pickett, PA-C 03/03/17 2143    Charlesetta Shanks, MD 03/04/17 [phone]

## 2017-03-05 ENCOUNTER — Encounter (HOSPITAL_COMMUNITY): Payer: Self-pay | Admitting: *Deleted

## 2017-03-05 ENCOUNTER — Emergency Department (HOSPITAL_COMMUNITY): Payer: Self-pay

## 2017-03-05 ENCOUNTER — Emergency Department (HOSPITAL_COMMUNITY)
Admission: EM | Admit: 2017-03-05 | Discharge: 2017-03-05 | Disposition: A | Discharge status: Home / Self Care | Payer: Self-pay | Attending: Physician Assistant | Admitting: Physician Assistant

## 2017-03-05 DIAGNOSIS — I10 Essential (primary) hypertension: Secondary | ICD-10-CM | POA: Insufficient documentation

## 2017-03-05 DIAGNOSIS — Z87891 Personal history of nicotine dependence: Secondary | ICD-10-CM | POA: Insufficient documentation

## 2017-03-05 DIAGNOSIS — R197 Diarrhea, unspecified: Secondary | ICD-10-CM | POA: Insufficient documentation

## 2017-03-05 DIAGNOSIS — R112 Nausea with vomiting, unspecified: Secondary | ICD-10-CM | POA: Insufficient documentation

## 2017-03-05 DIAGNOSIS — Z79899 Other long term (current) drug therapy: Secondary | ICD-10-CM | POA: Insufficient documentation

## 2017-03-05 LAB — CBC
HEMATOCRIT: 36.9 % (ref 36.0–46.0)
HEMOGLOBIN: 12.2 g/dL (ref 12.0–15.0)
MCH: 24.2 pg — ABNORMAL LOW (ref 26.0–34.0)
MCHC: 33.1 g/dL (ref 30.0–36.0)
MCV: 73.2 fL — AB (ref 78.0–100.0)
Platelets: 233 10*3/uL (ref 150–400)
RBC: 5.04 MIL/uL (ref 3.87–5.11)
RDW: 15.3 % (ref 11.5–15.5)
WBC: 5.8 10*3/uL (ref 4.0–10.5)

## 2017-03-05 LAB — COMPREHENSIVE METABOLIC PANEL
ALBUMIN: 3.6 g/dL (ref 3.5–5.0)
ALK PHOS: 71 U/L (ref 38–126)
ALT: 20 U/L (ref 14–54)
AST: 25 U/L (ref 15–41)
Anion gap: 9 (ref 5–15)
BUN: 6 mg/dL (ref 6–20)
CALCIUM: 8.8 mg/dL — AB (ref 8.9–10.3)
CHLORIDE: 103 mmol/L (ref 101–111)
CO2: 23 mmol/L (ref 22–32)
CREATININE: 0.72 mg/dL (ref 0.44–1.00)
GFR calc non Af Amer: >60 mL/min (ref >60)
GLUCOSE: 96 mg/dL (ref 65–99)
Potassium: 2.9 mmol/L — ABNORMAL LOW (ref 3.5–5.1)
SODIUM: 135 mmol/L (ref 135–145)
Total Bilirubin: 0.6 mg/dL (ref 0.3–1.2)
Total Protein: 6.5 g/dL (ref 6.5–8.1)

## 2017-03-05 LAB — I-STAT BETA HCG BLOOD, ED (MC, WL, AP ONLY): I-stat hCG, quantitative: <5 m[IU]/mL (ref <5)

## 2017-03-05 LAB — LIPASE, BLOOD: Lipase: 15 U/L (ref 11–51)

## 2017-03-05 MED ORDER — ONDANSETRON HCL 4 MG/2ML IJ SOLN
4.0000 mg | Freq: Once | INTRAMUSCULAR | Status: AC
  Administered 2017-03-05: 4 mg via INTRAVENOUS
  Filled 2017-03-05: qty 2

## 2017-03-05 MED ORDER — TRAMADOL HCL 50 MG PO TABS: 50.0000 mg | ORAL_TABLET | Freq: Four times a day (QID) | ORAL | 0 refills | Status: DC | PRN

## 2017-03-05 MED ORDER — POTASSIUM CHLORIDE CRYS ER 20 MEQ PO TBCR
40.0000 meq | EXTENDED_RELEASE_TABLET | Freq: Once | ORAL | Status: AC
  Administered 2017-03-05: 40 meq via ORAL
  Filled 2017-03-05: qty 2

## 2017-03-05 MED ORDER — ONDANSETRON HCL 4 MG PO TABS
4.0000 mg | ORAL_TABLET | Freq: Once | ORAL | Status: AC
  Administered 2017-03-05: 4 mg via ORAL
  Filled 2017-03-05: qty 1

## 2017-03-05 MED ORDER — HALOPERIDOL LACTATE 5 MG/ML IJ SOLN
2.0000 mg | Freq: Once | INTRAMUSCULAR | Status: AC
  Administered 2017-03-05: 2 mg via INTRAVENOUS
  Filled 2017-03-05: qty 1

## 2017-03-05 MED ORDER — SODIUM CHLORIDE 0.9 % IV BOLUS (SEPSIS)
1000.0000 mL | Freq: Once | INTRAVENOUS | Status: AC
  Administered 2017-03-05: 1000 mL via INTRAVENOUS

## 2017-03-05 MED ORDER — PROMETHAZINE HCL 12.5 MG PO TABS: 12.5000 mg | ORAL_TABLET | Freq: Four times a day (QID) | ORAL | 0 refills | Status: DC | PRN

## 2017-03-05 MED ORDER — IOPAMIDOL (ISOVUE-300) INJECTION 61%
100.0000 mL | Freq: Once | INTRAVENOUS | Status: AC | PRN
  Administered 2017-03-05: 100 mL via INTRAVENOUS

## 2017-03-05 NOTE — Discharge Instructions (Signed)
Please return immediately if you have a fever. Also return if you've any increase in pain in your right lower quadrant. Or any other concerns.

## 2017-03-05 NOTE — ED Notes (Signed)
Pt given ginger ale.

## 2017-03-05 NOTE — ED Provider Notes (Signed)
[facility] DEPT Provider Note   CSN: [id number] Arrival date & time: 03/05/17  1215  By signing my name below, I, Sarah Rosales, attest that this documentation has been prepared under the direction and in the presence of Tyreon Frigon Julio Alm, MD. Electronically Signed: Reinaldo Meeker, Scribe. 03/05/2017. 1:05 PM.  History   Chief Complaint Chief Complaint  Patient presents with  . Emesis  . Abdominal Pain   The history is provided by the patient and medical records. No language interpreter was used.    HPI Comments:  Sarah Rosales is a 36 y.o. female with a PMHx of HTN, who presents to the Emergency Department complaining of persistent emesis onset three days ago. Pt reports the emesis and associated diarrhea arising three days ago with stated daily episodes of emesis measuring x13 and diarrhea x4. She states being seen in the ED for same, two days ago and told her symptoms were of potential viral etiology. Pt has additional associated symptoms of nausea, RUQ abdominal pain, and reduced PO intake stating "every time I eat or drink something, it comes right back up." She tried Zofran and a BRAT diet at home with no relief of her symptoms. Direct palpation of her abdomen exacerbates her pain. Pt denies back pain, sore throat, vaginal discharge, cough, urgency, frequency, hematuria, dysuria, difficulty urinating, and any other complaints at this time.   Past Medical History:  Diagnosis Date  . Hypertension 2010   diagnosed while incarcerated   Patient Active Problem List   Diagnosis Date Noted  . Anxiety 12/07/2015  . Obesity, unspecified 12/07/2015  . Hypertension    History reviewed. No pertinent surgical history.  OB History    No data available     Home Medications    Prior to Admission medications   Medication Sig Start Date End Date Taking? Authorizing Provider  dicyclomine (BENTYL) 20 MG tablet Take 1 tablet (20 mg total) by mouth 2 (two) times daily. 03/03/17    Larene Pickett, PA-C  lisinopril (PRINIVIL,ZESTRIL) 10 MG tablet Take 1 tablet (10 mg total) by mouth daily. 12/06/15   Soyla Dryer, PA-C  ondansetron (ZOFRAN ODT) 4 MG disintegrating tablet Take 1 tablet (4 mg total) by mouth every 8 (eight) hours as needed for nausea. 03/03/17   Larene Pickett, PA-C  ranitidine (ZANTAC) 300 MG tablet Take 1 tablet (300 mg total) by mouth at bedtime. 12/06/15   Soyla Dryer, PA-C   Family History Family History  Problem Relation Age of Onset  . Hypertension Mother   . Cancer Mother     stomach  . Cancer Father   . Diabetes Sister   . COPD Sister   . Irritable bowel syndrome Sister   . Diabetes Brother   . Heart disease Brother     Social History Social History  Substance Use Topics  . Smoking status: Former Smoker    Types: Cigarettes    Quit date: 07/20/2015  . Smokeless tobacco: Never Used  . Alcohol use No     Comment: rarely   Allergies   Patient has no known allergies.   Review of Systems Review of Systems  Constitutional: Positive for appetite change.  HENT: Negative for sore throat.   Respiratory: Negative for cough.   Gastrointestinal: Positive for abdominal pain (RUQ), diarrhea, nausea and vomiting.  Genitourinary: Negative for difficulty urinating, dysuria, frequency, hematuria, urgency and vaginal discharge.  Musculoskeletal: Negative for back pain.     Physical Exam Updated Vital Signs BP Marland Kitchen)  131/105 (BP Location: Left Arm)   Pulse 67   Temp 97.9 F (36.6 C) (Oral)   Resp 20   Ht 5\' 2"  (1.575 m)   Wt 162 lb (73.5 kg)   LMP 02/17/2017 (Approximate)   SpO2 100%   BMI 29.63 kg/m   Physical Exam  Constitutional: She is oriented to person, place, and time. She appears well-developed and well-nourished.  HENT:  Head: Normocephalic.  Eyes: Conjunctivae are normal.  Cardiovascular: Normal rate.   Pulmonary/Chest: Effort normal.  Abdominal: She exhibits no distension.  Mild tenderness to the right side of her  abdomen both RUQ and RLQ. Negative murphy's sign.   Musculoskeletal: Normal range of motion.  Neurological: She is alert and oriented to person, place, and time.  Skin: Skin is warm and dry.  Psychiatric: She has a normal mood and affect.  Nursing note and vitals reviewed.    ED Treatments / Results  DIAGNOSTIC STUDIES:  Oxygen Saturation is 100% on RA, normal by my interpretation.    COORDINATION OF CARE:  12:52 PM Discussed treatment plan with pt at bedside including CT A/P, IV Fluids, UA, blood work, with  anti-emetics and pt agreed to plan.  5:15 PM Consult to surgery placed.   Labs (all labs ordered are listed, but only abnormal results are displayed) Labs Reviewed  CBC - Abnormal; Notable for the following:       Result Value   MCV 73.2 (*)    MCH 24.2 (*)    All other components within normal limits  COMPREHENSIVE METABOLIC PANEL - Abnormal; Notable for the following:    Potassium 2.9 (*)    Calcium 8.8 (*)    All other components within normal limits  LIPASE, BLOOD  URINALYSIS, ROUTINE W REFLEX MICROSCOPIC  I-STAT BETA HCG BLOOD, ED ([facility], [facility], [facility] ONLY)    EKG  EKG Interpretation None       Radiology Ct Abdomen Pelvis W Contrast  Result Date: 03/05/2017 CLINICAL DATA:  Right lower quadrant abdominal pain with nausea and vomiting. EXAM: CT ABDOMEN AND PELVIS WITH CONTRAST TECHNIQUE: Multidetector CT imaging of the abdomen and pelvis was performed using the standard protocol following bolus administration of intravenous contrast. CONTRAST:  168mL ISOVUE-300 IOPAMIDOL (ISOVUE-300) INJECTION 61% COMPARISON:  01/17/2015 FINDINGS: Lower chest: Unremarkable Hepatobiliary: Stable hypodense 9 mm in diameter lesion in segment 7 of the liver, image 16/5 otherwise unremarkable. Pancreas: Unremarkable Spleen: Unremarkable Adrenals/Urinary Tract: Unremarkable Stomach/Bowel: There is segment of the appendix which appears normal on images 65 through 73 of series 6. However, further  medially the appendix is poorly seen. There is a small amount of edema along the right lower paracolic gutter partially extending close to the appendix. The bowel appears otherwise unremarkable. Vascular/Lymphatic: Unremarkable Reproductive: Multiple uterine fibroids given the uterus a lobular appearance. There is some trace fluid adjacent to the right ovary. 1.1 cm complex lesion of the right ovary, possibly a small complex cyst or corpus luteum. Urine pregnancy test was -2 days ago. Hypodense rim enhancing 2.9 cm lesion along the left adnexa, possibly a subserosal fibroid or a complex cystic lesion of the left ovary. Other: No supplemental non-categorized findings. Musculoskeletal: Unremarkable IMPRESSION: 1. Small amount of fluid density adjacent to the right ovary, with a small complex right ovarian lesion which could be a complex cyst or corpus luteum. 2. Complex cystic lesion along the left ovarian margin could be a left ovarian cyst or possibly cystic degeneration of a subserosal fibroid along the adnexa. The patient  has multiple additional in uterine fibroids which appear larger than on the prior exam. 3. There is some faint edema stranding in the right lower quadrant near the appendix and along the right lower paracolic gutter. We visualize a moderate length segment of the appendix which appears normal, but the medial most portion of the appendix is obscured by adjacent bowel, which mildly lowers negative predictive value for entities such as tip appendicitis. Overall given the normal-appearing segment of the appendix on multiplanar images I am skeptical that the appendix is acutely inflamed. Electronically Signed   By: Van Clines M.D.   On: 03/05/2017 16:47    Procedures Procedures (including critical care time)  Medications Ordered in ED Medications  ondansetron (ZOFRAN) injection 4 mg (4 mg Intravenous Given 03/05/17 1305)  sodium chloride 0.9 % bolus 1,000 mL (1,000 mLs Intravenous New  Bag/Given 03/05/17 1305)  haloperidol lactate (HALDOL) injection 2 mg (2 mg Intravenous Given 03/05/17 1402)  potassium chloride SA (K-DUR,KLOR-CON) CR tablet 40 mEq (40 mEq Oral Given 03/05/17 1500)  iopamidol (ISOVUE-300) 61 % injection 100 mL (100 mLs Intravenous Contrast Given 03/05/17 1558)     Initial Impression / Assessment and Plan / ED Course  I have reviewed the triage vital signs and the nursing notes.  Pertinent labs & imaging results that were available during my care of the patient were reviewed by me and considered in my medical decision making (see chart for details).     I personally performed the services described in this documentation, which was scribed in my presence. The recorded information has been reviewed and is accurate.   Patient is a 36 year old female making her second visit to the emergency department the last several days. Patient was diagnosed with viral gastroenteritis 2 days ago and sent home with Bentyl and Zofran. Patient says she's been taking Zofran but is still had a couple episodes of vomiting and diarrhea. Patient complains of right-sided abdominal pain. It is hard to localize. I initially thought this still represents viral gastroenteritis however given this repeat visit we will do a CAT scan to make she that we are not missing an entity requiring surgical intervention.\  5:07 PM CT came back with indeterminate read. Part of the appendicitis was visualized, part it was not, will touch base with surgery.   7:06 PM Discussed with Dr. Donne Hazel from surgery. He does not think it is likely appendicitis. Patient has normal white count. No fever. Patient has not vomited or had diarrhea since arrival here. Discussed with patient and patient's friend at bedside. Patient has been taking multiple hot showers per day. She is reporting it is the only thing that making it better. She does smoke weed. It is possible the patient's condition is either viral gastroenteritis,  or hyperemesis cannabis syndrome. Patient has normal vitals. Normal labs except for low potassium. Again patient has not had any diarrhea today suspect that she is on the mend. We will give her an additional antiemetic to take home as well as strict return precautions for fever, worsening right lower quadrant pain, or any other concerns.  Final Clinical Impressions(s) / ED Diagnoses   Final diagnoses:  None    New Prescriptions New Prescriptions   No medications on file     Dail Lerew Julio Alm, MD 03/05/17 Einar Crow

## 2017-03-05 NOTE — ED Notes (Signed)
Patient transported to CT 

## 2017-03-05 NOTE — ED Triage Notes (Signed)
Pt was here on Monday for same. Reports right side abd pain with n/v/d.

## 2017-08-04 ENCOUNTER — Emergency Department (HOSPITAL_COMMUNITY)
Admission: EM | Admit: 2017-08-04 | Discharge: 2017-08-04 | Disposition: A | Discharge status: Home / Self Care | Payer: Self-pay | Attending: Emergency Medicine | Admitting: Emergency Medicine

## 2017-08-04 DIAGNOSIS — Z5321 Procedure and treatment not carried out due to patient leaving prior to being seen by health care provider: Secondary | ICD-10-CM | POA: Insufficient documentation

## 2017-08-04 DIAGNOSIS — M25532 Pain in left wrist: Secondary | ICD-10-CM | POA: Insufficient documentation

## 2017-08-04 NOTE — ED Notes (Signed)
Ice pack to left arm.

## 2017-08-04 NOTE — ED Notes (Signed)
No answer for room 

## 2017-08-04 NOTE — ED Triage Notes (Signed)
PT states woke up this am with left wrist pain and denies injury.  No falls.  Pt has swelling to left wrist and into forearm. Pulse present

## 2017-08-04 NOTE — ED Notes (Signed)
Patient called x 2 with no answer 

## 2017-08-05 ENCOUNTER — Emergency Department (HOSPITAL_COMMUNITY)
Admission: EM | Admit: 2017-08-05 | Discharge: 2017-08-05 | Disposition: A | Discharge status: Home / Self Care | Payer: Self-pay | Attending: Emergency Medicine | Admitting: Emergency Medicine

## 2017-08-05 ENCOUNTER — Encounter (HOSPITAL_COMMUNITY): Payer: Self-pay | Admitting: *Deleted

## 2017-08-05 DIAGNOSIS — I10 Essential (primary) hypertension: Secondary | ICD-10-CM | POA: Insufficient documentation

## 2017-08-05 DIAGNOSIS — M778 Other enthesopathies, not elsewhere classified: Secondary | ICD-10-CM | POA: Insufficient documentation

## 2017-08-05 DIAGNOSIS — Z79899 Other long term (current) drug therapy: Secondary | ICD-10-CM | POA: Insufficient documentation

## 2017-08-05 DIAGNOSIS — Z87891 Personal history of nicotine dependence: Secondary | ICD-10-CM | POA: Insufficient documentation

## 2017-08-05 MED ORDER — AMLODIPINE BESYLATE 5 MG PO TABS
5.0000 mg | ORAL_TABLET | Freq: Once | ORAL | Status: AC
  Administered 2017-08-05: 5 mg via ORAL
  Filled 2017-08-05: qty 1

## 2017-08-05 MED ORDER — HYDROCHLOROTHIAZIDE 12.5 MG PO CAPS: 12.5000 mg | ORAL_CAPSULE | Freq: Every day | ORAL | 0 refills | Status: DC

## 2017-08-05 MED ORDER — TRAMADOL HCL 50 MG PO TABS: 50.0000 mg | ORAL_TABLET | Freq: Four times a day (QID) | ORAL | 0 refills | Status: DC | PRN

## 2017-08-05 MED ORDER — ONDANSETRON HCL 4 MG PO TABS
4.0000 mg | ORAL_TABLET | Freq: Once | ORAL | Status: AC
  Administered 2017-08-05: 4 mg via ORAL
  Filled 2017-08-05: qty 1

## 2017-08-05 MED ORDER — IBUPROFEN 800 MG PO TABS
800.0000 mg | ORAL_TABLET | Freq: Once | ORAL | Status: AC
  Administered 2017-08-05: 800 mg via ORAL
  Filled 2017-08-05: qty 1

## 2017-08-05 MED ORDER — DEXAMETHASONE 4 MG PO TABS: 4.0000 mg | ORAL_TABLET | Freq: Two times a day (BID) | ORAL | 0 refills | Status: DC

## 2017-08-05 MED ORDER — TRAMADOL HCL 50 MG PO TABS
100.0000 mg | ORAL_TABLET | Freq: Once | ORAL | Status: AC
  Administered 2017-08-05: 100 mg via ORAL
  Filled 2017-08-05: qty 2

## 2017-08-05 NOTE — ED Provider Notes (Addendum)
[facility] DEPT Provider Note   CSN: [id number] Arrival date & time: 08/05/17  2024     History   Chief Complaint Chief Complaint  Patient presents with  . Wrist Pain    HPI Sarah Rosales is a 36 y.o. female.  Patient is a 36 year old female who presents to the emergency department with a complaint of wrist pain.  The patient states that she will cup approximately 2 or 3 days ago with severe pain in the left wrist. She heard a pop when she moved her wrist in certain ways. She at times has pain when she is just laying down or resting, but the pain is worse when she moves it or she attempts to lift something. She's not had any previous operations or procedures involving the wrist. She's not had any injury to the wrist that she is aware of. She states that up until one month ago she was very active and used her arms and wrists frequently for her job. She has not been working over the past month. She does not recall doing anything extra or excessive is far as her work at home is concerned. She presents now for assistance with this issue.  The patient also states that she has a history of hypertension. She has not been able to see a physician because of no insurance. She states that she was seen at the emergency department at [facility] on yesterday and was noted to have a blood pressure of 174/108. She requests assistance with her blood pressure while she is here.      Past Medical History:  Diagnosis Date  . Hypertension 2010   diagnosed while incarcerated    Patient Active Problem List   Diagnosis Date Noted  . Anxiety 12/07/2015  . Obesity, unspecified 12/07/2015  . Hypertension     No past surgical history on file.  OB History    No data available       Home Medications    Prior to Admission medications   Medication Sig Start Date End Date Taking? Authorizing Provider  dexamethasone (DECADRON) 4 MG tablet Take 1 tablet (4 mg total) by mouth 2 (two) times  daily with a meal. 08/05/17   Lily Kocher, PA-C  dicyclomine (BENTYL) 20 MG tablet Take 1 tablet (20 mg total) by mouth 2 (two) times daily. 03/03/17   Larene Pickett, PA-C  hydrochlorothiazide (MICROZIDE) 12.5 MG capsule Take 1 capsule (12.5 mg total) by mouth daily. 08/05/17   Lily Kocher, PA-C  lisinopril (PRINIVIL,ZESTRIL) 10 MG tablet Take 1 tablet (10 mg total) by mouth daily. 12/06/15   Soyla Dryer, PA-C  ondansetron (ZOFRAN ODT) 4 MG disintegrating tablet Take 1 tablet (4 mg total) by mouth every 8 (eight) hours as needed for nausea. 03/03/17   Larene Pickett, PA-C  promethazine (PHENERGAN) 12.5 MG tablet Take 1 tablet (12.5 mg total) by mouth every 6 (six) hours as needed for nausea or vomiting. 03/05/17   Mackuen, Courteney Lyn, MD  ranitidine (ZANTAC) 300 MG tablet Take 1 tablet (300 mg total) by mouth at bedtime. 12/06/15   Soyla Dryer, PA-C  traMADol (ULTRAM) 50 MG tablet Take 1 tablet (50 mg total) by mouth every 6 (six) hours as needed. 08/05/17   Lily Kocher, PA-C    Family History Family History  Problem Relation Age of Onset  . Hypertension Mother   . Cancer Mother        stomach  . Cancer Father   . Diabetes Sister   .  COPD Sister   . Irritable bowel syndrome Sister   . Diabetes Brother   . Heart disease Brother     Social History Social History  Substance Use Topics  . Smoking status: Former Smoker    Types: Cigarettes    Quit date: 07/20/2015  . Smokeless tobacco: Never Used  . Alcohol use No     Comment: rarely     Allergies   Patient has no known allergies.   Review of Systems Review of Systems  Constitutional: Negative for activity change.       All ROS Neg except as noted in HPI  HENT: Negative for nosebleeds.   Eyes: Negative for photophobia and discharge.  Respiratory: Negative for cough, shortness of breath and wheezing.   Cardiovascular: Negative for chest pain and palpitations.  Gastrointestinal: Negative for abdominal pain and  blood in stool.  Genitourinary: Negative for dysuria, frequency and hematuria.  Musculoskeletal: Positive for arthralgias. Negative for back pain and neck pain.  Skin: Negative.   Neurological: Negative for dizziness, seizures and speech difficulty.  Psychiatric/Behavioral: Negative for confusion and hallucinations.     Physical Exam Updated Vital Signs BP (!) 175/98 (BP Location: Right Arm)   Pulse 84   Temp 98.7 F (37.1 C) (Oral)   Resp 16   Ht 5\' 3"  (1.6 m)   Wt 72.6 kg (160 lb)   LMP 07/24/2017 (Approximate)   SpO2 100%   BMI 28.34 kg/m   Physical Exam  Constitutional: She is oriented to person, place, and time. She appears well-developed and well-nourished.  Non-toxic appearance.  HENT:  Head: Normocephalic.  Right Ear: Tympanic membrane and external ear normal.  Left Ear: Tympanic membrane and external ear normal.  Eyes: Pupils are equal, round, and reactive to light. EOM and lids are normal.  Neck: Normal range of motion. Neck supple. Carotid bruit is not present.  Cardiovascular: Normal rate, regular rhythm, normal heart sounds, intact distal pulses and normal pulses.   Pulmonary/Chest: Breath sounds normal. No respiratory distress.  Abdominal: Soft. Bowel sounds are normal. There is no tenderness. There is no guarding.  Musculoskeletal:       Left wrist: She exhibits decreased range of motion and tenderness. She exhibits no deformity.  There is pain of the left wrist with pronation and supination. There is pain with flexion and extension against resistance. The radial pulses 2+. The capillary refill is less than 2 seconds. There no temperature changes between the right and left wrist and upper extremity.  Lymphadenopathy:       Head (right side): No submandibular adenopathy present.       Head (left side): No submandibular adenopathy present.    She has no cervical adenopathy.  Neurological: She is alert and oriented to person, place, and time. She has normal  strength. No cranial nerve deficit or sensory deficit.  Skin: Skin is warm and dry.  Psychiatric: She has a normal mood and affect. Her speech is normal.  Nursing note and vitals reviewed.    ED Treatments / Results  Labs (all labs ordered are listed, but only abnormal results are displayed) Labs Reviewed - No data to display  EKG  EKG Interpretation None       Radiology No results found.  Procedures Procedures (including critical care time) Splint. Pt identified by armband.  I discussed with the patient the need for the splint because of tendinitis.  Patient is in agreement for application of the splint. Velcro wrist-forearm splint applied to the  left upper extremity by nursing staff.  After application of the splint, the patient has good capillary refill that is less than 2 seconds.  There are no temperature changes or sensory changes noted of the left upper extremity.  Patient tolerated the procedure without problem. . Medications Ordered in ED Medications  amLODipine (NORVASC) tablet 5 mg (not administered)  traMADol (ULTRAM) tablet 100 mg (not administered)  ondansetron (ZOFRAN) tablet 4 mg (not administered)  ibuprofen (ADVIL,MOTRIN) tablet 800 mg (not administered)     Initial Impression / Assessment and Plan / ED Course  I have reviewed the triage vital signs and the nursing notes.  Pertinent labs & imaging results that were available during my care of the patient were reviewed by me and considered in my medical decision making (see chart for details).       Final Clinical Impressions(s) / ED Diagnoses MDM Blood pressure is elevated at 175/98, otherwise the vital signs within normal limits. The examination favors tendinitis of the left wrist. No neurovascular deficits appreciated at this time.  Patient fitted with a wrist splint. Prescription for Decadron and Ultram given to the patient. The patient will use 600 mg of ibuprofen 3 times daily to assist with  her pain and inflammation. Patient is referred to orthopedics if this is not improving.  The patient states that she has not been seen by a physician for her blood pressure and some time. I strongly encouraged patient to go back to the free clinic for assistance with her blood pressure. A prescription for hydrochlorothiazide has been given to the patient to use for now. The patient knowledge is understanding of these instructions and is in agreement.    Final diagnoses:  Tendonitis of wrist, left  Essential hypertension    New Prescriptions New Prescriptions   DEXAMETHASONE (DECADRON) 4 MG TABLET    Take 1 tablet (4 mg total) by mouth 2 (two) times daily with a meal.   HYDROCHLOROTHIAZIDE (MICROZIDE) 12.5 MG CAPSULE    Take 1 capsule (12.5 mg total) by mouth daily.   TRAMADOL (ULTRAM) 50 MG TABLET    Take 1 tablet (50 mg total) by mouth every 6 (six) hours as needed.     Lily Kocher, PA-C 08/05/17 2141    Margette Fast, MD 08/06/17 0913    Lily Kocher, PA-C 09/07/17 1659    Long, Wonda Olds, MD 09/08/17 [phone]

## 2017-08-05 NOTE — ED Triage Notes (Signed)
LEFT WRIST PAIN

## 2017-08-05 NOTE — Discharge Instructions (Signed)
Please usual wrist splint daily. You do not need to sleep in this device. Please use Decadron 2 times daily. Use 600 mg of ibuprofen 3 times daily with a meal. Please use the hydrochlorothiazide for your blood pressure. It is extremely important that you see the physicians at the free clinic, or the health department as soon as possible. Use Ultram for more severe pain. This medication may cause drowsiness, please use it with caution. Please see Dr. Aline Brochure, or the orthopedic specialist of your choice if you're tendinitis is not improving.

## 2018-10-08 ENCOUNTER — Other Ambulatory Visit: Payer: Self-pay

## 2018-10-08 ENCOUNTER — Emergency Department (HOSPITAL_COMMUNITY)
Admission: EM | Admit: 2018-10-08 | Discharge: 2018-10-08 | Disposition: A | Discharge status: Home / Self Care | Payer: Self-pay | Attending: Emergency Medicine | Admitting: Emergency Medicine

## 2018-10-08 ENCOUNTER — Encounter (HOSPITAL_COMMUNITY): Payer: Self-pay

## 2018-10-08 ENCOUNTER — Emergency Department (HOSPITAL_COMMUNITY): Payer: Self-pay

## 2018-10-08 DIAGNOSIS — L049 Acute lymphadenitis, unspecified: Secondary | ICD-10-CM

## 2018-10-08 DIAGNOSIS — I10 Essential (primary) hypertension: Secondary | ICD-10-CM | POA: Insufficient documentation

## 2018-10-08 DIAGNOSIS — Z79899 Other long term (current) drug therapy: Secondary | ICD-10-CM | POA: Insufficient documentation

## 2018-10-08 DIAGNOSIS — L040 Acute lymphadenitis of face, head and neck: Secondary | ICD-10-CM | POA: Insufficient documentation

## 2018-10-08 DIAGNOSIS — Z87891 Personal history of nicotine dependence: Secondary | ICD-10-CM | POA: Insufficient documentation

## 2018-10-08 LAB — GROUP A STREP BY PCR: GROUP A STREP BY PCR: NOT DETECTED

## 2018-10-08 LAB — POC URINE PREG, ED: Preg Test, Ur: NEGATIVE

## 2018-10-08 MED ORDER — CEPHALEXIN 500 MG PO CAPS: 500.0000 mg | ORAL_CAPSULE | Freq: Four times a day (QID) | ORAL | 0 refills | Status: DC

## 2018-10-08 MED ORDER — LISINOPRIL 10 MG PO TABS: 10.0000 mg | ORAL_TABLET | Freq: Every day | ORAL | 0 refills | Status: DC

## 2018-10-08 MED ORDER — ACETAMINOPHEN 325 MG PO TABS
650.0000 mg | ORAL_TABLET | Freq: Once | ORAL | Status: AC
  Administered 2018-10-08: 650 mg via ORAL
  Filled 2018-10-08: qty 2

## 2018-10-08 MED ORDER — LIDOCAINE VISCOUS HCL 2 % MT SOLN
15.0000 mL | Freq: Once | OROMUCOSAL | Status: AC
  Administered 2018-10-08: 15 mL via OROMUCOSAL
  Filled 2018-10-08: qty 15

## 2018-10-08 NOTE — ED Triage Notes (Signed)
Pt presents to ED with complaints of sore throat and tonsils swelling on left side which started 2 days ago. Pt denies fever. Pt denies congestion, cough. Pts Bp 151/124. Pt with history of HTN but out of BP meds.

## 2018-10-08 NOTE — Discharge Instructions (Addendum)
You are being treated for a suspected infected lymph node.  Take the entire course of the antibiotic prescribed.  You may also try warm compresses to the site which may help relieve your symptoms.  I suggest only Tylenol if needed for pain relief, as ibuprofen can elevate your blood pressure.  Start taking your lisinopril and establish care with the free clinic as discussed.  You really need to be careful about running out of your blood pressure medications as long-term this can be very harmful to you.  Get rechecked for any worsening symptoms or if this lymph node does not resolve over the next 2 weeks.  It may stay slightly enlarged for a while longer than that as it can take some time for the swelling to go away but the pain should be gone by the time you finish the antibiotics.

## 2018-10-09 NOTE — ED Provider Notes (Signed)
[facility] EMERGENCY DEPARTMENT Provider Note   CSN: [id number] Arrival date & time: 10/08/18  1014     History   Chief Complaint Chief Complaint  Patient presents with  . Sore Throat    HPI Sarah Rosales is a 37 y.o. female with a history of htn but out of her bp meds x 3+ weeks presenting with a 2 day history of sore throat and left sided neck swelling and pain.  Her pain is constant but worsened with swallowing and palpating along the left neck and jawline. She denies fevers, chills, nasal congestion, face or headache, vision problems, She does have a persistent dry cough present for the past 2 weeks.   She has had no treatment prior to arrival.   The history is provided by the patient.    Past Medical History:  Diagnosis Date  . Hypertension 2010   diagnosed while incarcerated    Patient Active Problem List   Diagnosis Date Noted  . Anxiety 12/07/2015  . Obesity, unspecified 12/07/2015  . Hypertension     History reviewed. No pertinent surgical history.   OB History   None      Home Medications    Prior to Admission medications   Medication Sig Start Date End Date Taking? Authorizing Provider  cephALEXin (KEFLEX) 500 MG capsule Take 1 capsule (500 mg total) by mouth 4 (four) times daily. 10/08/18   Evalee Jefferson, PA-C  dexamethasone (DECADRON) 4 MG tablet Take 1 tablet (4 mg total) by mouth 2 (two) times daily with a meal. 08/05/17   Lily Kocher, PA-C  dicyclomine (BENTYL) 20 MG tablet Take 1 tablet (20 mg total) by mouth 2 (two) times daily. 03/03/17   Larene Pickett, PA-C  hydrochlorothiazide (MICROZIDE) 12.5 MG capsule Take 1 capsule (12.5 mg total) by mouth daily. 08/05/17   Lily Kocher, PA-C  lisinopril (PRINIVIL,ZESTRIL) 10 MG tablet Take 1 tablet (10 mg total) by mouth daily. 10/08/18   Evalee Jefferson, PA-C  ondansetron (ZOFRAN ODT) 4 MG disintegrating tablet Take 1 tablet (4 mg total) by mouth every 8 (eight) hours as needed for nausea. 03/03/17    Larene Pickett, PA-C  promethazine (PHENERGAN) 12.5 MG tablet Take 1 tablet (12.5 mg total) by mouth every 6 (six) hours as needed for nausea or vomiting. 03/05/17   Mackuen, Courteney Lyn, MD  ranitidine (ZANTAC) 300 MG tablet Take 1 tablet (300 mg total) by mouth at bedtime. 12/06/15   Soyla Dryer, PA-C  traMADol (ULTRAM) 50 MG tablet Take 1 tablet (50 mg total) by mouth every 6 (six) hours as needed. 08/05/17   Lily Kocher, PA-C    Family History Family History  Problem Relation Age of Onset  . Hypertension Mother   . Cancer Mother        stomach  . Cancer Father   . Diabetes Sister   . COPD Sister   . Irritable bowel syndrome Sister   . Diabetes Brother   . Heart disease Brother     Social History Social History   Tobacco Use  . Smoking status: Former Smoker    Types: Cigarettes    Last attempt to quit: 07/20/2015    Years since quitting: 3.2  . Smokeless tobacco: Never Used  Substance Use Topics  . Alcohol use: No    Comment: rarely  . Drug use: Yes    Types: Marijuana    Comment: none since  2015     Allergies   Patient has no known  allergies.   Review of Systems Review of Systems  Constitutional: Negative for chills and fever.  HENT: Positive for sore throat. Negative for congestion, facial swelling, rhinorrhea and sinus pain.   Eyes: Negative.  Negative for visual disturbance.  Respiratory: Positive for cough. Negative for chest tightness and shortness of breath.   Cardiovascular: Negative for chest pain.  Gastrointestinal: Negative for abdominal pain and nausea.  Genitourinary: Negative.   Musculoskeletal: Positive for neck pain. Negative for arthralgias and joint swelling.  Skin: Negative.  Negative for rash and wound.  Neurological: Negative for dizziness, weakness, light-headedness, numbness and headaches.  Psychiatric/Behavioral: Negative.      Physical Exam Updated Vital Signs BP (!) 187/109 (BP Location: Right Arm)   Pulse 72   Temp 98.4  F (36.9 C) (Oral)   Resp 18   Ht 5\' 3"  (1.6 m)   Wt 90.7 kg   LMP 09/15/2018   SpO2 100%   BMI 35.43 kg/m   Physical Exam  Constitutional: She is oriented to person, place, and time. She appears well-developed and well-nourished.  Non-toxic appearance.  HENT:  Head: Normocephalic and atraumatic.  Right Ear: Tympanic membrane and ear canal normal. No mastoid tenderness.  Left Ear: Tympanic membrane and ear canal normal. No mastoid tenderness.  Nose: No mucosal edema or rhinorrhea.  Mouth/Throat: Uvula is midline and mucous membranes are normal. No trismus in the jaw. Posterior oropharyngeal erythema present. No oropharyngeal exudate, posterior oropharyngeal edema or tonsillar abscesses.  Mild posterior erythema.  Tonsils symmetric without hypertrophy, uvula midline. Sublingual space soft. Hypertrophied left tonsillar node.  Eyes: Conjunctivae are normal.  Neck: Phonation normal. No neck rigidity.  Cardiovascular: Normal rate and normal heart sounds.  Pulmonary/Chest: Effort normal. No respiratory distress. She has no wheezes. She has no rales.  Musculoskeletal: Normal range of motion.  Lymphadenopathy:       Head (left side): Tonsillar adenopathy present.  Left large and tender tonsillar lymph node.  No facial or neck erythema.  Neurological: She is alert and oriented to person, place, and time.  Skin: Skin is warm and dry. No rash noted.  Psychiatric: She has a normal mood and affect.     ED Treatments / Results  Labs (all labs ordered are listed, but only abnormal results are displayed) Labs Reviewed  GROUP A STREP BY PCR  POC URINE PREG, ED    EKG None  Radiology Dg Chest 2 View  Result Date: 10/08/2018 CLINICAL DATA:  Productive cough and dyspnea x1 week. EXAM: CHEST - 2 VIEW COMPARISON:  11/02/2015 FINDINGS: The heart size and mediastinal contours are within normal limits. Both lungs are clear. The visualized skeletal structures are unremarkable. IMPRESSION: No  active cardiopulmonary disease. Electronically Signed   By: Ashley Royalty M.D.   On: 10/08/2018 14:11    Procedures Procedures (including critical care time)  Medications Ordered in ED Medications  lidocaine (XYLOCAINE) 2 % viscous mouth solution 15 mL (15 mLs Mouth/Throat Given 10/08/18 1323)  acetaminophen (TYLENOL) tablet 650 mg (650 mg Oral Given 10/08/18 1457)     Initial Impression / Assessment and Plan / ED Course  I have reviewed the triage vital signs and the nursing notes.  Pertinent labs & imaging results that were available during my care of the patient were reviewed by me and considered in my medical decision making (see chart for details).     Pt given lidocaine gel for gargling, no improvement in pain.  Suspect pain is entirely from the reactive tonsillar node  and not the pharynx.  She was started on keflex for suspected lymphadenitis, also discussed warm compresses, tylenol.  Avoid nsaids given elevated bp.  She was given a refill of her lisinopril. Advised to re [facility] with [facility] as she is currently planning.  Return precautions discussed.  Final Clinical Impressions(s) / ED Diagnoses   Final diagnoses:  Essential hypertension  Acute lymphadenitis    ED Discharge Orders         Ordered    cephALEXin (KEFLEX) 500 MG capsule  4 times daily     10/08/18 1450    lisinopril (PRINIVIL,ZESTRIL) 10 MG tablet  Daily     10/08/18 1450           Evalee Jefferson, PA-C 10/09/18 1329    Milton Ferguson, MD 10/10/18 1029

## 2019-10-01 ENCOUNTER — Emergency Department (HOSPITAL_COMMUNITY): Payer: Self-pay

## 2019-10-01 ENCOUNTER — Emergency Department (HOSPITAL_COMMUNITY)
Admission: EM | Admit: 2019-10-01 | Discharge: 2019-10-01 | Disposition: A | Discharge status: Home / Self Care | Payer: Self-pay | Attending: Emergency Medicine | Admitting: Emergency Medicine

## 2019-10-01 ENCOUNTER — Encounter (HOSPITAL_COMMUNITY): Payer: Self-pay | Admitting: Student

## 2019-10-01 ENCOUNTER — Other Ambulatory Visit: Payer: Self-pay

## 2019-10-01 DIAGNOSIS — D649 Anemia, unspecified: Secondary | ICD-10-CM | POA: Insufficient documentation

## 2019-10-01 DIAGNOSIS — Z87891 Personal history of nicotine dependence: Secondary | ICD-10-CM | POA: Insufficient documentation

## 2019-10-01 DIAGNOSIS — R079 Chest pain, unspecified: Secondary | ICD-10-CM | POA: Insufficient documentation

## 2019-10-01 DIAGNOSIS — I10 Essential (primary) hypertension: Secondary | ICD-10-CM | POA: Insufficient documentation

## 2019-10-01 DIAGNOSIS — R55 Syncope and collapse: Secondary | ICD-10-CM | POA: Insufficient documentation

## 2019-10-01 DIAGNOSIS — Z79899 Other long term (current) drug therapy: Secondary | ICD-10-CM | POA: Insufficient documentation

## 2019-10-01 LAB — BASIC METABOLIC PANEL
Anion gap: 9 (ref 5–15)
BUN: 10 mg/dL (ref 6–20)
CO2: 19 mmol/L — ABNORMAL LOW (ref 22–32)
Calcium: 9 mg/dL (ref 8.9–10.3)
Chloride: 109 mmol/L (ref 98–111)
Creatinine, Ser: 0.7 mg/dL (ref 0.44–1.00)
GFR calc Af Amer: >60 mL/min (ref >60)
GFR calc non Af Amer: >60 mL/min (ref >60)
Glucose, Bld: 100 mg/dL — ABNORMAL HIGH (ref 70–99)
Potassium: 3.9 mmol/L (ref 3.5–5.1)
Sodium: 137 mmol/L (ref 135–145)

## 2019-10-01 LAB — CBC
HCT: 32.7 % — ABNORMAL LOW (ref 36.0–46.0)
Hemoglobin: 9.3 g/dL — ABNORMAL LOW (ref 12.0–15.0)
MCH: 18.5 pg — ABNORMAL LOW (ref 26.0–34.0)
MCHC: 28.4 g/dL — ABNORMAL LOW (ref 30.0–36.0)
MCV: 64.9 fL — ABNORMAL LOW (ref 80.0–100.0)
Platelets: 302 10*3/uL (ref 150–400)
RBC: 5.04 MIL/uL (ref 3.87–5.11)
RDW: 21.3 % — ABNORMAL HIGH (ref 11.5–15.5)
WBC: 7.3 10*3/uL (ref 4.0–10.5)
nRBC: 0 % (ref 0.0–0.2)

## 2019-10-01 LAB — D-DIMER, QUANTITATIVE: D-Dimer, Quant: 0.73 ug/mL-FEU — ABNORMAL HIGH (ref 0.00–0.50)

## 2019-10-01 LAB — TROPONIN I (HIGH SENSITIVITY)
Troponin I (High Sensitivity): <2 ng/L (ref <18)
Troponin I (High Sensitivity): <2 ng/L (ref <18)

## 2019-10-01 LAB — HCG, SERUM, QUALITATIVE: Preg, Serum: NEGATIVE

## 2019-10-01 MED ORDER — KETOROLAC TROMETHAMINE 30 MG/ML IJ SOLN
15.0000 mg | Freq: Once | INTRAMUSCULAR | Status: AC
  Administered 2019-10-01: 15 mg via INTRAVENOUS
  Filled 2019-10-01: qty 1

## 2019-10-01 MED ORDER — NAPROXEN 500 MG PO TABS: 500.0000 mg | ORAL_TABLET | Freq: Two times a day (BID) | ORAL | 0 refills | Status: AC

## 2019-10-01 MED ORDER — LISINOPRIL 5 MG PO TABS
5.0000 mg | ORAL_TABLET | Freq: Once | ORAL | Status: AC
  Administered 2019-10-01: 18:00:00 5 mg via ORAL
  Filled 2019-10-01: qty 1

## 2019-10-01 MED ORDER — FERROUS SULFATE 325 (65 FE) MG PO TABS: 325.0000 mg | ORAL_TABLET | Freq: Every day | ORAL | 0 refills | Status: DC

## 2019-10-01 MED ORDER — IOHEXOL 350 MG/ML SOLN
100.0000 mL | Freq: Once | INTRAVENOUS | Status: AC | PRN
  Administered 2019-10-01: 18:00:00 100 mL via INTRAVENOUS

## 2019-10-01 MED ORDER — METHOCARBAMOL 500 MG PO TABS: 500.0000 mg | ORAL_TABLET | Freq: Three times a day (TID) | ORAL | 0 refills | Status: AC | PRN

## 2019-10-01 MED ORDER — SODIUM CHLORIDE 0.9% FLUSH: 3.0000 mL | Freq: Once | INTRAVENOUS | Status: DC

## 2019-10-01 MED ORDER — FENTANYL CITRATE (PF) 100 MCG/2ML IJ SOLN
50.0000 ug | Freq: Once | INTRAMUSCULAR | Status: AC
  Administered 2019-10-01: 16:00:00 50 ug via INTRAVENOUS
  Filled 2019-10-01: qty 2

## 2019-10-01 MED ORDER — LISINOPRIL 10 MG PO TABS: 10.0000 mg | ORAL_TABLET | Freq: Every day | ORAL | 0 refills | Status: AC

## 2019-10-01 MED ORDER — POLYETHYLENE GLYCOL 3350 17 G PO PACK: 17.0000 g | PACK | Freq: Every day | ORAL | 0 refills | Status: AC | PRN

## 2019-10-01 NOTE — ED Notes (Signed)
Patient transported to CT 

## 2019-10-01 NOTE — ED Provider Notes (Signed)
[facility] EMERGENCY DEPARTMENT Provider Note   CSN: [id number] Arrival date & time: 10/01/19  1131     History   Chief Complaint Chief Complaint  Patient presents with  . Chest Pain    HPI Sarah Rosales is a 38 y.o. female with a history of hypertension, obesity, and anxiety who presents to the emergency department with complaints of chest pain x 3 days. Patient states that 3 days prior she was having a relatively normal day, she had been quite stressed, but as she was walking through her home she started to have a ringing in her ears, got lightheaded, & passed out. She does not think she injured herself but was not entirely sure.  She went to bed shortly after and woke up the next morning with right-sided chest and neck discomfort.  She was not having any neck pain, chest pain or shortness of breath prior to her syncopal event. She states chest pain is R sided, radiates to back, it is constant & sharp, worse with deep breaths & twisting/turning motions associated with intermittent dyspnea & nausea. Neck pain is worse with movement. No recurrent syncope. She continues to have intermittent tinnitus, but this has been occurring intermittently recently and is not necessarily new. She has been out of her Lisinopril (10 mg daily) for 2 weeks now. Denies visual disturbance, headache, vomiting, dizziness like the room spinning, numbness, weakness, leg pain/swelling, hemoptysis, recent surgery/trauma, recent long travel, hormone use, personal hx of cancer, or hx of DVT/PE.        HPI  Past Medical History:  Diagnosis Date  . Hypertension 2010   diagnosed while incarcerated    Patient Active Problem List   Diagnosis Date Noted  . Anxiety 12/07/2015  . Obesity, unspecified 12/07/2015  . Hypertension     No past surgical history on file.   OB History   No obstetric history on file.      Home Medications    Prior to Admission medications   Medication Sig Start Date End Date  Taking? Authorizing Provider  cephALEXin (KEFLEX) 500 MG capsule Take 1 capsule (500 mg total) by mouth 4 (four) times daily. 10/08/18   Evalee Jefferson, PA-C  dexamethasone (DECADRON) 4 MG tablet Take 1 tablet (4 mg total) by mouth 2 (two) times daily with a meal. 08/05/17   Lily Kocher, PA-C  dicyclomine (BENTYL) 20 MG tablet Take 1 tablet (20 mg total) by mouth 2 (two) times daily. 03/03/17   Larene Pickett, PA-C  hydrochlorothiazide (MICROZIDE) 12.5 MG capsule Take 1 capsule (12.5 mg total) by mouth daily. 08/05/17   Lily Kocher, PA-C  lisinopril (PRINIVIL,ZESTRIL) 10 MG tablet Take 1 tablet (10 mg total) by mouth daily. 10/08/18   Evalee Jefferson, PA-C  ondansetron (ZOFRAN ODT) 4 MG disintegrating tablet Take 1 tablet (4 mg total) by mouth every 8 (eight) hours as needed for nausea. 03/03/17   Larene Pickett, PA-C  promethazine (PHENERGAN) 12.5 MG tablet Take 1 tablet (12.5 mg total) by mouth every 6 (six) hours as needed for nausea or vomiting. 03/05/17   Mackuen, Courteney Lyn, MD  ranitidine (ZANTAC) 300 MG tablet Take 1 tablet (300 mg total) by mouth at bedtime. 12/06/15   Soyla Dryer, PA-C  traMADol (ULTRAM) 50 MG tablet Take 1 tablet (50 mg total) by mouth every 6 (six) hours as needed. 08/05/17   Lily Kocher, PA-C    Family History Family History  Problem Relation Age of Onset  . Hypertension Mother   .  Cancer Mother        stomach  . Cancer Father   . Diabetes Sister   . COPD Sister   . Irritable bowel syndrome Sister   . Diabetes Brother   . Heart disease Brother     Social History Social History   Tobacco Use  . Smoking status: Former Smoker    Types: Cigarettes    Quit date: 07/20/2015    Years since quitting: 4.2  . Smokeless tobacco: Never Used  Substance Use Topics  . Alcohol use: No    Comment: rarely  . Drug use: Yes    Types: Marijuana    Comment: none since  2015     Allergies   Patient has no known allergies.   Review of Systems Review of Systems   Constitutional: Negative for chills and fever.  HENT: Negative for congestion, ear pain and sore throat.   Eyes: Negative for visual disturbance.  Respiratory: Positive for shortness of breath. Negative for cough.   Cardiovascular: Positive for chest pain. Negative for leg swelling.  Gastrointestinal: Positive for nausea. Negative for abdominal pain and vomiting.  Musculoskeletal: Positive for neck pain.  Neurological: Positive for syncope. Negative for dizziness, weakness, numbness and headaches.  All other systems reviewed and are negative.    Physical Exam Updated Vital Signs BP (!) 176/103 (BP Location: Right Arm)   Pulse 82   Temp 98.9 F (37.2 C) (Oral)   Resp 16   Ht 5\' 3"  (1.6 m)   Wt 83.5 kg   LMP 09/18/2019   SpO2 100%   BMI 32.59 kg/m   Physical Exam Vitals signs and nursing note reviewed.  Constitutional:      General: She is not in acute distress.    Appearance: Normal appearance. She is well-developed. She is not ill-appearing or toxic-appearing.  HENT:     Head: Normocephalic and atraumatic.     Right Ear: Tympanic membrane, ear canal and external ear normal. Tympanic membrane is not perforated, erythematous, retracted or bulging.     Left Ear: Tympanic membrane, ear canal and external ear normal. Tympanic membrane is not perforated, erythematous, retracted or bulging.  Eyes:     General:        Right eye: No discharge.        Left eye: No discharge.     Conjunctiva/sclera: Conjunctivae normal.  Neck:     Musculoskeletal: Normal range of motion and neck supple. Spinous process tenderness (diffuse no midline) and muscular tenderness (R sided) present.     Comments: No midline tenderness.  Cardiovascular:     Rate and Rhythm: Normal rate and regular rhythm.     Pulses:          Radial pulses are 2+ on the right side and 2+ on the left side.       Posterior tibial pulses are 2+ on the right side and 2+ on the left side.  Pulmonary:     Effort: Pulmonary  effort is normal. No respiratory distress.     Breath sounds: Normal breath sounds. No wheezing, rhonchi or rales.  Chest:     Chest wall: No tenderness.  Abdominal:     General: There is no distension.     Palpations: Abdomen is soft.     Tenderness: There is no abdominal tenderness.  Musculoskeletal:     Right lower leg: She exhibits no tenderness. No edema.     Left lower leg: She exhibits no tenderness. No edema.  Comments: Upper extremities: No obvious deformity, appreciable swelling, edema, erythema, ecchymosis, warmth, or open wounds. Patient has intact AROM throughout, some pain with right shoulder flexion.  Tenderness to the right trapezius muscle. Tenderness to R superior scapular region, otherwise nontender.  Back: no midline tenderness LEs: nontender.   Skin:    General: Skin is warm and dry.     Capillary Refill: Capillary refill takes less than 2 seconds.     Findings: No rash.  Neurological:     Mental Status: She is alert.     Comments: Clear speech.  CN III to XII grossly intact.  Incision present at right upper and lower extremities.  Ambulatory.  Psychiatric:        Mood and Affect: Mood normal.        Behavior: Behavior normal.      ED Treatments / Results  Labs (all labs ordered are listed, but only abnormal results are displayed) Labs Reviewed  BASIC METABOLIC PANEL - Abnormal; Notable for the following components:      Result Value   CO2 19 (*)    Glucose, Bld 100 (*)    All other components within normal limits  CBC - Abnormal; Notable for the following components:   Hemoglobin 9.3 (*)    HCT 32.7 (*)    MCV 64.9 (*)    MCH 18.5 (*)    MCHC 28.4 (*)    RDW 21.3 (*)    All other components within normal limits  POC URINE PREG, ED  TROPONIN I (HIGH SENSITIVITY)  TROPONIN I (HIGH SENSITIVITY)    EKG None    Date: 10/01/2019  Rate: 76 bpm  Rhythm: normal sinus rhythm  QRS Axis: normal  Intervals: normal  ST/T Wave abnormalities: normal   Conduction Disutrbances: none  Narrative Interpretation: no STEMI, sinus rhythm      Radiology Dg Chest 2 View  Result Date: 10/01/2019 CLINICAL DATA:  Chest pain EXAM: CHEST - 2 VIEW COMPARISON:  October 08, 2018 FINDINGS: The heart size and mediastinal contours are within normal limits. Both lungs are clear. No pleural effusion or pneumothorax. The visualized skeletal structures are unremarkable. IMPRESSION: No acute process in the chest. Electronically Signed   By: Macy Mis M.D.   On: 10/01/2019 12:26    Procedures Procedures (including critical care time)  Medications Ordered in ED Medications  sodium chloride flush (NS) 0.9 % injection 3 mL (3 mLs Intravenous Not Given 10/01/19 1601)  ketorolac (TORADOL) 30 MG/ML injection 15 mg (has no administration in time range)  lisinopril (ZESTRIL) tablet 5 mg (5 mg Oral Given 10/01/19 1817)  fentaNYL (SUBLIMAZE) injection 50 mcg (50 mcg Intravenous Given 10/01/19 1612)  iohexol (OMNIPAQUE) 350 MG/ML injection 100 mL (100 mLs Intravenous Contrast Given 10/01/19 1745)     Initial Impression / Assessment and Plan / ED Course  I have reviewed the triage vital signs and the nursing notes.  Pertinent labs & imaging results that were available during my care of the patient were reviewed by me and considered in my medical decision making (see chart for details).   Patient presents to the emergency department with complaints of chest pain, neck pain, as well as a syncopal episode a few days prior.  She is nontoxic-appearing, resting comfortably, vitals WNL with exception of elevated blood pressure, this is in the setting of not having her antihypertensive medications over the past 2 weeks, doubt hypertensive emergency.  Patient has reproducibility of her neck discomfort with midline cervical spine,  right-sided cervical paraspinal muscle, & R scapular palpation. Her chest pain is not necessarily reproducible with palpation, but is  reproducible with twisting/turning motions.  Work-up reviewed: CBC: No leukocytosis.  Patient is has a microcytic hypochromic anemia, she states she has been told she is anemic before, she states that she has very heavy periods.  BMP: No significant electrolyte derangements.  Renal function preserved. Pregnancy test: Negative. Troponin: < 2, < 2 EKG: No STEMI D-dimer elevated, subsequent CTA obtained negative for pulmonary embolism. CT head/C-spine without significant abnormalities.  Right shoulder xray without fracture/dislocation.  Overall reassuring work-up. EKG without significant ischemic changes, troponin < 2 x 2- Doubt ACS Low risk Wells, D-dimer positive, CTA negative, doubt PE. Mediastinum on imaging, symmetric pulses, doubt dissection. Patient is somewhat anemic, she states this is not new, do not feel that this is a symptomatic anemia.  No arrhythmias noted on cardiac monitor. Overall unclear etiology to presentation.  Will try treating both her neck and chest discomfort as muscular pain with naproxen/Robaxin, discussed no driving or operating heavy machinery when taking Robaxin.  We will also start her on iron supplements given her anemia is felt to be iron deficiency related, she states she has taken these in the past and had issues with constipation therefore will provide MiraLAX.  We will also provide 30-day refill of her blood pressure medicine.  She was hemodynamically stable and appropriate for discharge. I discussed results, treatment plan, need for follow-up, and return precautions with the patient. Provided opportunity for questions, patient confirmed understanding and is in agreement with plan.   Final Clinical Impressions(s) / ED Diagnoses   Final diagnoses:  Chest pain, unspecified type  Syncope, unspecified syncope type  Anemia, unspecified type    ED Discharge Orders         Ordered    naproxen (NAPROSYN) 500 MG tablet  2 times daily     10/01/19 1825     methocarbamol (ROBAXIN) 500 MG tablet  Every 8 hours PRN     10/01/19 1825    ferrous sulfate 325 (65 FE) MG tablet  Daily     10/01/19 1825    polyethylene glycol (MIRALAX) 17 g packet  Daily PRN     10/01/19 1825    lisinopril (ZESTRIL) 10 MG tablet  Daily     10/01/19 1825           Amaryllis Dyke, PA-C 10/01/19 1905    Virgel Manifold, MD 10/01/19 2037

## 2019-10-01 NOTE — Discharge Instructions (Addendum)
You were seen in the emergency department today for chest pain and an episode of passing out.  Your work-up was overall reassuring.  Your imaging did not show any substantial new abnormalities.  You did not have a blood clot.  Your labs and EKG did not show findings consistent with a heart attack.  Your labs did show that you are anemic with a hemoglobin of 9.3, we are starting an iron supplement for this, also starting you on MiraLAX to take as needed for constipation, provided diet recommendations to help avoid constipation.  We are also sending you home with a refill of your blood pressure medication.  We are also sending him with the following medicines to try to treat your pain:  - Naproxen is a nonsteroidal anti-inflammatory medication that will help with pain and swelling. Be sure to take this medication as prescribed with food, 1 pill every 12 hours,  It should be taken with food, as it can cause stomach upset, and more seriously, stomach bleeding. Do not take other nonsteroidal anti-inflammatory medications with this such as Advil, Motrin, Aleve, Mobic, Goodie Powder, or Motrin.    - Robaxin is the muscle relaxer I have prescribed, this is meant to help with muscle tightness. Be aware that this medication may make you drowsy therefore the first time you take this it should be at a time you are in an environment where you can rest. Do not drive or operate heavy machinery when taking this medication. Do not drink alcohol or take other sedating medications with this medicine such as narcotics or benzodiazepines.   You make take Tylenol per over the counter dosing with these medications.   We have prescribed you new medication(s) today. Discuss the medications prescribed today with your pharmacist as they can have adverse effects and interactions with your other medicines including over the counter and prescribed medications. Seek medical evaluation if you start to experience new or abnormal symptoms  after taking one of these medicines, seek care immediately if you start to experience difficulty breathing, feeling of your throat closing, facial swelling, or rash as these could be indications of a more serious allergic reaction    We like you to follow-up with your primary care provider within 3 days.  If you do not have a primary care provider please see options below.  Return to the ER for new or worsening symptoms including but not limited to worsening pain, passing out again, numbness, weakness, headache, change in your vision, fever, or any other concerns. [facility] Primary Care Doctor List    Sinda Du MD. Specialty: Pulmonary Disease Contact information: [place]  [place] [address]  [phone]   Tula Nakayama, MD. Specialty: [facility] Contact information: [address] [place] [address]  [phone]   Sallee Lange, MD. Specialty: [facility] Medicine Contact information: [place]  [place] [address]  [phone]   Rosita Fire, MD Specialty: Internal Medicine Contact information: [place] [address]  [phone]   Delphina Cahill, MD. Specialty: Internal Medicine Contact information: [place] [address]  [phone]    [facility] (Dr. Maudie Mercury) Specialty: Family Medicine Contact information: [place] [address]  [phone]   Leslie Andrea, MD. Specialty: [facility] Medicine Contact information: [place] [address]  [phone]   Asencion Noble, MD. Specialty: Internal Medicine  Contact information: [address]  [place] [address]  [place]  [address] [place], [place] [address] [phone]  Services [facility] offers a variety of basic health services.  Services include  but are not limited to: Blood pressure checks  Heart rate checks  Blood sugar checks  Urine analysis  Rapid strep tests  Pregnancy tests.  Health education and referrals  People needing more complex services will be directed to a physician online. Using these virtual visits, doctors can evaluate and prescribe medicine and treatments. There will be no medication on-site, though [facility] will help patients fill their prescriptions at little to no cost.   For More information please go to: [email or web address]

## 2019-10-01 NOTE — ED Triage Notes (Signed)
Patient reports chest pain with LOC 2 nights ago. Patient states chest pain has continued. Patient reports sharp R sided chest pain that radiates into her back. C/O nausea since LOC. Reports SOB and lightheadedness before LOC. ECG completed in Triage.

## 2019-10-01 NOTE — ED Notes (Signed)
Patient transported to X-ray 

## 2020-05-22 ENCOUNTER — Ambulatory Visit: Payer: Self-pay | Admitting: Physician Assistant

## 2020-06-29 ENCOUNTER — Ambulatory Visit: Payer: Self-pay | Attending: Internal Medicine

## 2020-06-29 DIAGNOSIS — Z23 Encounter for immunization: Secondary | ICD-10-CM

## 2020-06-29 NOTE — Progress Notes (Signed)
   Covid-19 Vaccination Clinic  Name:  Sarah Rosales    MRN: [id number] DOB: 1981/01/06  06/29/2020  Sarah Rosales was observed post Covid-19 immunization for 15 minutes without incident. She was provided with Vaccine Information Sheet and instruction to access the V-Safe system.   Sarah Rosales was instructed to call 911 with any severe reactions post vaccine: Marland Kitchen Difficulty breathing  . Swelling of face and throat  . A fast heartbeat  . A bad rash all over body  . Dizziness and weakness   Immunizations Administered    Name Date Dose VIS Date Route   Pfizer COVID-19 Vaccine 06/29/2020 10:53 AM 0.3 mL 12/29/2018 Intramuscular   Manufacturer: [place]   Lot: [id number]   [place]: [id number]

## 2020-07-20 ENCOUNTER — Ambulatory Visit: Payer: Self-pay

## 2020-07-31 ENCOUNTER — Emergency Department (HOSPITAL_COMMUNITY)
Admission: EM | Admit: 2020-07-31 | Discharge: 2020-07-31 | Disposition: A | Discharge status: Home / Self Care | Payer: Self-pay | Attending: Emergency Medicine | Admitting: Emergency Medicine

## 2020-07-31 ENCOUNTER — Encounter (HOSPITAL_COMMUNITY): Payer: Self-pay

## 2020-07-31 ENCOUNTER — Emergency Department (HOSPITAL_COMMUNITY): Payer: Self-pay

## 2020-07-31 ENCOUNTER — Other Ambulatory Visit: Payer: Self-pay

## 2020-07-31 DIAGNOSIS — N12 Tubulo-interstitial nephritis, not specified as acute or chronic: Secondary | ICD-10-CM | POA: Insufficient documentation

## 2020-07-31 DIAGNOSIS — I10 Essential (primary) hypertension: Secondary | ICD-10-CM | POA: Insufficient documentation

## 2020-07-31 DIAGNOSIS — D509 Iron deficiency anemia, unspecified: Secondary | ICD-10-CM | POA: Insufficient documentation

## 2020-07-31 DIAGNOSIS — Z79899 Other long term (current) drug therapy: Secondary | ICD-10-CM | POA: Insufficient documentation

## 2020-07-31 DIAGNOSIS — Z87891 Personal history of nicotine dependence: Secondary | ICD-10-CM | POA: Insufficient documentation

## 2020-07-31 LAB — URINALYSIS, ROUTINE W REFLEX MICROSCOPIC
Bacteria, UA: NONE SEEN
Bilirubin Urine: NEGATIVE
Glucose, UA: NEGATIVE mg/dL
Ketones, ur: 5 mg/dL — AB
Nitrite: NEGATIVE
Protein, ur: 30 mg/dL — AB
Specific Gravity, Urine: 1.028 (ref 1.005–1.030)
pH: 5 (ref 5.0–8.0)

## 2020-07-31 LAB — CBC WITH DIFFERENTIAL/PLATELET
Abs Immature Granulocytes: 0.02 10*3/uL (ref 0.00–0.07)
Basophils Absolute: 0 10*3/uL (ref 0.0–0.1)
Basophils Relative: 0 %
Eosinophils Absolute: 0.1 10*3/uL (ref 0.0–0.5)
Eosinophils Relative: 1 %
HCT: 31.6 % — ABNORMAL LOW (ref 36.0–46.0)
Hemoglobin: 9 g/dL — ABNORMAL LOW (ref 12.0–15.0)
Immature Granulocytes: 0 %
Lymphocytes Relative: 32 %
Lymphs Abs: 2.6 10*3/uL (ref 0.7–4.0)
MCH: 17.6 pg — ABNORMAL LOW (ref 26.0–34.0)
MCHC: 28.5 g/dL — ABNORMAL LOW (ref 30.0–36.0)
MCV: 62 fL — ABNORMAL LOW (ref 80.0–100.0)
Monocytes Absolute: 0.6 10*3/uL (ref 0.1–1.0)
Monocytes Relative: 7 %
Neutro Abs: 4.9 10*3/uL (ref 1.7–7.7)
Neutrophils Relative %: 60 %
Platelets: 352 10*3/uL (ref 150–400)
RBC: 5.1 MIL/uL (ref 3.87–5.11)
RDW: 22.9 % — ABNORMAL HIGH (ref 11.5–15.5)
WBC: 8.2 10*3/uL (ref 4.0–10.5)
nRBC: 0 % (ref 0.0–0.2)

## 2020-07-31 LAB — COMPREHENSIVE METABOLIC PANEL
ALT: 18 U/L (ref 0–44)
AST: 19 U/L (ref 15–41)
Albumin: 3.9 g/dL (ref 3.5–5.0)
Alkaline Phosphatase: 91 U/L (ref 38–126)
Anion gap: 10 (ref 5–15)
BUN: 9 mg/dL (ref 6–20)
CO2: 23 mmol/L (ref 22–32)
Calcium: 9 mg/dL (ref 8.9–10.3)
Chloride: 105 mmol/L (ref 98–111)
Creatinine, Ser: 0.81 mg/dL (ref 0.44–1.00)
GFR calc Af Amer: >60 mL/min (ref >60)
GFR calc non Af Amer: >60 mL/min (ref >60)
Glucose, Bld: 94 mg/dL (ref 70–99)
Potassium: 4 mmol/L (ref 3.5–5.1)
Sodium: 138 mmol/L (ref 135–145)
Total Bilirubin: 1 mg/dL (ref 0.3–1.2)
Total Protein: 7.5 g/dL (ref 6.5–8.1)

## 2020-07-31 LAB — PREGNANCY, URINE: Preg Test, Ur: NEGATIVE

## 2020-07-31 LAB — LIPASE, BLOOD: Lipase: 24 U/L (ref 11–51)

## 2020-07-31 MED ORDER — MORPHINE SULFATE (PF) 4 MG/ML IV SOLN
4.0000 mg | Freq: Once | INTRAVENOUS | Status: AC
  Administered 2020-07-31: 4 mg via INTRAVENOUS
  Filled 2020-07-31: qty 1

## 2020-07-31 MED ORDER — ONDANSETRON HCL 4 MG/2ML IJ SOLN
4.0000 mg | Freq: Once | INTRAMUSCULAR | Status: AC
  Administered 2020-07-31: 4 mg via INTRAVENOUS
  Filled 2020-07-31: qty 2

## 2020-07-31 MED ORDER — FERROUS SULFATE 325 (65 FE) MG PO TABS: 325.0000 mg | ORAL_TABLET | Freq: Every day | ORAL | 0 refills | Status: AC

## 2020-07-31 MED ORDER — CEPHALEXIN 500 MG PO CAPS: 500.0000 mg | ORAL_CAPSULE | Freq: Four times a day (QID) | ORAL | 0 refills | Status: AC

## 2020-07-31 MED ORDER — FLUCONAZOLE 200 MG PO TABS: 200.0000 mg | ORAL_TABLET | Freq: Every day | ORAL | 0 refills | Status: AC

## 2020-07-31 MED ORDER — CEPHALEXIN 500 MG PO CAPS
500.0000 mg | ORAL_CAPSULE | Freq: Once | ORAL | Status: AC
  Administered 2020-07-31: 500 mg via ORAL
  Filled 2020-07-31: qty 1

## 2020-07-31 MED ORDER — KETOROLAC TROMETHAMINE 30 MG/ML IJ SOLN
30.0000 mg | Freq: Once | INTRAMUSCULAR | Status: AC
  Administered 2020-07-31: 30 mg via INTRAVENOUS
  Filled 2020-07-31: qty 1

## 2020-07-31 MED ORDER — SODIUM CHLORIDE 0.9 % IV BOLUS
1000.0000 mL | Freq: Once | INTRAVENOUS | Status: AC
  Administered 2020-07-31: 1000 mL via INTRAVENOUS

## 2020-07-31 NOTE — ED Provider Notes (Signed)
[facility] EMERGENCY DEPARTMENT Provider Note   CSN: [id number] Arrival date & time: 07/31/20  3295     History Chief Complaint  Patient presents with  . Abdominal Pain    Sarah Rosales is a 39 y.o. female.  HPI      Sarah Rosales is a 39 y.o. female who presents to the Emergency Department complaining of left sided lower abdominal pain.  Symptoms began 2 days ago.  Pain is gradually worsened and now associated with several episodes of watery brown stool and vomiting.  No melena or hematochezia.  She also endorses having some intermittent chills but no known fever.  She states that she ate a premade salad from the grocery store and states that her symptoms began shortly afterwards.  She denies any urinary frequency or hesitancy or burning with urination.  No history of kidney stones.  No prior abdominal surgeries.  No sick contacts.    Past Medical History:  Diagnosis Date  . Hypertension 2010   diagnosed while incarcerated    Patient Active Problem List   Diagnosis Date Noted  . Anxiety 12/07/2015  . Obesity, unspecified 12/07/2015  . Hypertension     History reviewed. No pertinent surgical history.   OB History   No obstetric history on file.     Family History  Problem Relation Age of Onset  . Hypertension Mother   . Cancer Mother        stomach  . Cancer Father   . Diabetes Sister   . COPD Sister   . Irritable bowel syndrome Sister   . Diabetes Brother   . Heart disease Brother     Social History   Tobacco Use  . Smoking status: Former Smoker    Types: Cigarettes    Quit date: 07/20/2015    Years since quitting: 5.0  . Smokeless tobacco: Never Used  Vaping Use  . Vaping Use: Never used  Substance Use Topics  . Alcohol use: No    Comment: rarely  . Drug use: Yes    Types: Marijuana    Comment: none since  2015    Home Medications Prior to Admission medications   Medication Sig Start Date End Date Taking? Authorizing Provider    ferrous sulfate 325 (65 FE) MG tablet Take 1 tablet (325 mg total) by mouth daily. 10/01/19   Petrucelli, Samantha R, PA-C  lisinopril (ZESTRIL) 10 MG tablet Take 1 tablet (10 mg total) by mouth daily. 10/01/19   Petrucelli, Samantha R, PA-C  methocarbamol (ROBAXIN) 500 MG tablet Take 1 tablet (500 mg total) by mouth every 8 (eight) hours as needed for muscle spasms. 10/01/19   Petrucelli, Samantha R, PA-C  naproxen (NAPROSYN) 500 MG tablet Take 1 tablet (500 mg total) by mouth 2 (two) times daily. 10/01/19   Petrucelli, Samantha R, PA-C  polyethylene glycol (MIRALAX) 17 g packet Take 17 g by mouth daily as needed for moderate constipation. 10/01/19   Petrucelli, Glynda Jaeger, PA-C    Allergies    Patient has no known allergies.  Review of Systems   Review of Systems  Constitutional: Positive for chills. Negative for appetite change and fever.  Respiratory: Negative for shortness of breath.   Cardiovascular: Negative for chest pain.  Gastrointestinal: Positive for abdominal pain, diarrhea, nausea and vomiting. Negative for blood in stool.  Genitourinary: Positive for flank pain. Negative for decreased urine volume, difficulty urinating, dysuria and hematuria.  Musculoskeletal: Negative for back pain.  Skin: Negative for color  change and rash.  Neurological: Negative for dizziness, weakness and numbness.  Hematological: Negative for adenopathy.    Physical Exam Updated Vital Signs BP (!) 144/92 (BP Location: Left Arm)   Pulse 65   Temp 98 F (36.7 C) (Oral)   Resp 18   Ht 5\' 3"  (1.6 m)   Wt 72.6 kg   LMP 07/27/2020   SpO2 98%   BMI 28.34 kg/m   Physical Exam Vitals and nursing note reviewed.  Constitutional:      Appearance: Normal appearance. She is well-developed. She is not ill-appearing or toxic-appearing.  HENT:     Mouth/Throat:     Mouth: Mucous membranes are moist.  Cardiovascular:     Rate and Rhythm: Normal rate and regular rhythm.     Pulses: Normal pulses.   Pulmonary:     Effort: Pulmonary effort is normal.     Breath sounds: Normal breath sounds.  Chest:     Chest wall: No tenderness.  Abdominal:     Palpations: Abdomen is soft.     Tenderness: There is abdominal tenderness. There is no right CVA tenderness.     Comments: Tender to palpation of the left mid and left lower abdomen.  No guarding or rebound tenderness.  Abdomen is soft.  Mild left CVA tenderness.  Musculoskeletal:        General: Normal range of motion.     Right lower leg: No edema.     Left lower leg: No edema.  Skin:    General: Skin is warm.     Capillary Refill: Capillary refill takes less than 2 seconds.     Findings: No rash.  Neurological:     General: No focal deficit present.     Mental Status: She is alert.     Sensory: No sensory deficit.     Motor: No weakness.     ED Results / Procedures / Treatments   Labs (all labs ordered are listed, but only abnormal results are displayed) Labs Reviewed  CBC WITH DIFFERENTIAL/PLATELET - Abnormal; Notable for the following components:      Result Value   Hemoglobin 9.0 (*)    HCT 31.6 (*)    MCV 62.0 (*)    MCH 17.6 (*)    MCHC 28.5 (*)    RDW 22.9 (*)    All other components within normal limits  URINALYSIS, ROUTINE W REFLEX MICROSCOPIC - Abnormal; Notable for the following components:   Hgb urine dipstick MODERATE (*)    Ketones, ur 5 (*)    Protein, ur 30 (*)    Leukocytes,Ua MODERATE (*)    All other components within normal limits  URINE CULTURE  COMPREHENSIVE METABOLIC PANEL  LIPASE, BLOOD  PREGNANCY, URINE    EKG None  Radiology CT Renal Stone Study  Result Date: 07/31/2020 CLINICAL DATA:  39 year old with hematuria. Left abdominal pain. Loose stools. EXAM: CT ABDOMEN AND PELVIS WITHOUT CONTRAST TECHNIQUE: Multidetector CT imaging of the abdomen and pelvis was performed following the standard protocol without IV contrast. COMPARISON:  03/05/2017 FINDINGS: Lower chest: Tiny peripheral nodule  anterior right middle lobe on sequence 4, image 5 appears stable and compatible with a benign finding. Otherwise, lung bases are clear. Hepatobiliary: Again noted is a small hypodensity along the posterior right hepatic lobe measuring roughly 8 mm and probably represents a hepatic cyst. Normal appearance of the gallbladder. No biliary dilatation. Pancreas: Unremarkable. No pancreatic ductal dilatation or surrounding inflammatory changes. Spleen: Normal in size without focal  abnormality. Adrenals/Urinary Tract: No gross abnormality involving the adrenal tissue. Negative for kidney stones or hydronephrosis. Normal appearance of the urinary bladder. Stomach/Bowel: Stomach is within normal limits. Appendix appears normal. No evidence of bowel wall thickening, distention, or inflammatory changes. Vascular/Lymphatic: No significant vascular findings are present. No enlarged abdominal or pelvic lymph nodes. Reproductive: Again noted is an enlarged uterus with evidence of multiple fibroids. No evidence for adnexal mass. Calcifications involving an anterior right uterine fibroid. Other: Negative for free fluid. Negative for free air. Small umbilical hernia containing fat. Again noted is mild fat stranding in the right lower quadrant on series 2, image 65. This small amount of fat stranding appears chronic. No evidence for acute inflammation in this area. Musculoskeletal: No acute abnormality. Mild facet arthropathy along the right side of L5-S1. IMPRESSION: 1. No acute abnormality in the abdomen or pelvis. Mild fat stranding in the right lower quadrant appears chronic. 2. Negative for kidney stones or hydronephrosis. 3. Fibroid uterus. Electronically Signed   By: Markus Daft M.D.   On: 07/31/2020 11:16    Procedures Procedures (including critical care time)  Medications Ordered in ED Medications  sodium chloride 0.9 % bolus 1,000 mL (has no administration in time range)  ondansetron (ZOFRAN) injection 4 mg (has no  administration in time range)  morphine 4 MG/ML injection 4 mg (has no administration in time range)    ED Course  I have reviewed the triage vital signs and the nursing notes.  Pertinent labs & imaging results that were available during my care of the patient were reviewed by me and considered in my medical decision making (see chart for details).    MDM Rules/Calculators/A&P                          Patient here with 2-day history of left flank and left lower abdominal pain.  No history of dysuria symptoms.  Vomiting intermittently.  She is afebrile.  Vital signs are reassuring.  She is nontoxic-appearing.  Laboratory studies show hemoglobin of 9 she does have history of anemia and does not currently take iron.  No leukocytosis.  Electrolytes reassuring.  Urinalysis shows moderate hemoglobin with moderate leukocytes, 21-50 white cells and 0-5 squamous epithelials.  Urine culture is pending.  CT renal stone study shows no evidence of hydronephrosis or kidney stone.   Patient has known history of uterine fibroids.Patient does have some left-sided CVA tenderness and symptoms are felt to be related to early pyelonephritis.    On recheck, patient is resting comfortably.  She states her pain has improved.  I feel that she is appropriate for discharge home, will start her on Keflex and I have recommended that she begin iron supplement.  She agrees to close follow-up with her PCP at [facility] clinic.   Final Clinical Impression(s) / ED Diagnoses Final diagnoses:  Pyelonephritis  Iron deficiency anemia, unspecified iron deficiency anemia type    Rx / DC Orders ED Discharge Orders    None       Bufford Lope 07/31/20 1159    Noemi Chapel, MD 08/05/20 [phone]

## 2020-07-31 NOTE — ED Notes (Signed)
Pt transported to CT ?

## 2020-07-31 NOTE — ED Triage Notes (Signed)
Pt reports vomiting and left sided abd pain x 2 days. Reports loose stools.  Denies fever but says has had chills.

## 2020-07-31 NOTE — Discharge Instructions (Addendum)
Your work-up today shows that you likely have a urinary tract infection that may be traveling toward your left kidney area.  It is important that you take the antibiotic as directed until it is finished.  You may take ibuprofen 600 mg every 6-8 hours as needed for pain.  Drink plenty of fluids.  Your blood work today also shows that you are not anemic.  You will need to take an iron supplement as prescribed.  You may also want to take Colace while taking the iron supplement as it can cause some constipation.  You went on to have your hemoglobin rechecked in 2 weeks, your primary care provider can arrange this for you.  Return to the emergency department if you develop any worsening symptoms such as persistent vomiting, increasing pain or fever.

## 2020-08-02 LAB — URINE CULTURE

## 2020-08-03 ENCOUNTER — Ambulatory Visit: Payer: Self-pay

## 2020-08-26 ENCOUNTER — Other Ambulatory Visit: Payer: Self-pay

## 2020-08-26 ENCOUNTER — Emergency Department (HOSPITAL_COMMUNITY)
Admission: EM | Admit: 2020-08-26 | Discharge: 2020-08-26 | Disposition: A | Discharge status: Home / Self Care | Payer: Self-pay | Attending: Emergency Medicine | Admitting: Emergency Medicine

## 2020-08-26 DIAGNOSIS — Z79899 Other long term (current) drug therapy: Secondary | ICD-10-CM | POA: Insufficient documentation

## 2020-08-26 DIAGNOSIS — Z23 Encounter for immunization: Secondary | ICD-10-CM | POA: Insufficient documentation

## 2020-08-26 DIAGNOSIS — Y9389 Activity, other specified: Secondary | ICD-10-CM | POA: Insufficient documentation

## 2020-08-26 DIAGNOSIS — I10 Essential (primary) hypertension: Secondary | ICD-10-CM | POA: Insufficient documentation

## 2020-08-26 DIAGNOSIS — Y9269 Other specified industrial and construction area as the place of occurrence of the external cause: Secondary | ICD-10-CM | POA: Insufficient documentation

## 2020-08-26 DIAGNOSIS — W260XXA Contact with knife, initial encounter: Secondary | ICD-10-CM | POA: Insufficient documentation

## 2020-08-26 DIAGNOSIS — Z87891 Personal history of nicotine dependence: Secondary | ICD-10-CM | POA: Insufficient documentation

## 2020-08-26 DIAGNOSIS — S61011A Laceration without foreign body of right thumb without damage to nail, initial encounter: Secondary | ICD-10-CM | POA: Insufficient documentation

## 2020-08-26 MED ORDER — TETANUS-DIPHTH-ACELL PERTUSSIS 5-2.5-18.5 LF-MCG/0.5 IM SUSP
0.5000 mL | Freq: Once | INTRAMUSCULAR | Status: AC
  Administered 2020-08-26: 0.5 mL via INTRAMUSCULAR
  Filled 2020-08-26: qty 0.5

## 2020-08-26 NOTE — ED Notes (Signed)
Patient verbalizes understanding of discharge instructions. Opportunity for questioning and answers were provided. Pt discharged from ED. 

## 2020-08-26 NOTE — ED Triage Notes (Signed)
Pt. Stated, I was at work and injured my rt. Thumb.  Cut on the tip of thumb. Bleeding controlled.

## 2020-08-26 NOTE — ED Provider Notes (Signed)
[facility] EMERGENCY DEPARTMENT Provider Note   CSN: [id number] Arrival date & time: 08/26/20  1044     History Chief Complaint  Patient presents with  . Finger Injury    Sarah Rosales is a 39 y.o. female.  The history is provided by the patient. No language interpreter was used.     39 year old female presenting for evaluation of thumb injury.  Patient reports today while at work she accidentally cut right thumb with a knife while she was working.  She report acute onset of sharp pain to the affected area with some bleeding.  Her supervisor wants her to come to the ER to be evaluated.  She does not complain of any associated numbness.  She does not recall last tetanus status.  She rates her pain at 9 out of 10.  She is right-hand dominant.  She denies any other injury.  Past Medical History:  Diagnosis Date  . Hypertension 2010   diagnosed while incarcerated    Patient Active Problem List   Diagnosis Date Noted  . Anxiety 12/07/2015  . Obesity, unspecified 12/07/2015  . Hypertension     No past surgical history on file.   OB History   No obstetric history on file.     Family History  Problem Relation Age of Onset  . Hypertension Mother   . Cancer Mother        stomach  . Cancer Father   . Diabetes Sister   . COPD Sister   . Irritable bowel syndrome Sister   . Diabetes Brother   . Heart disease Brother     Social History   Tobacco Use  . Smoking status: Former Smoker    Types: Cigarettes    Quit date: 07/20/2015    Years since quitting: 5.1  . Smokeless tobacco: Never Used  Vaping Use  . Vaping Use: Never used  Substance Use Topics  . Alcohol use: No    Comment: rarely  . Drug use: Yes    Types: Marijuana    Comment: none since  2015    Home Medications Prior to Admission medications   Medication Sig Start Date End Date Taking? Authorizing Provider  cephALEXin (KEFLEX) 500 MG capsule Take 1 capsule (500 mg total) by  mouth 4 (four) times daily. 07/31/20   Triplett, Tammy, PA-C  ferrous sulfate 325 (65 FE) MG tablet Take 1 tablet (325 mg total) by mouth daily. 07/31/20   Triplett, Tammy, PA-C  lisinopril (ZESTRIL) 10 MG tablet Take 1 tablet (10 mg total) by mouth daily. 10/01/19   Petrucelli, Samantha R, PA-C  methocarbamol (ROBAXIN) 500 MG tablet Take 1 tablet (500 mg total) by mouth every 8 (eight) hours as needed for muscle spasms. 10/01/19   Petrucelli, Samantha R, PA-C  naproxen (NAPROSYN) 500 MG tablet Take 1 tablet (500 mg total) by mouth 2 (two) times daily. 10/01/19   Petrucelli, Samantha R, PA-C  polyethylene glycol (MIRALAX) 17 g packet Take 17 g by mouth daily as needed for moderate constipation. 10/01/19   Petrucelli, Glynda Jaeger, PA-C    Allergies    Patient has no known allergies.  Review of Systems   Review of Systems  Constitutional: Negative for fever.  Skin: Positive for wound.  Neurological: Negative for numbness.    Physical Exam Updated Vital Signs BP (!) 170/112 (BP Location: Left Arm)   Pulse 62   Temp 98 F (36.7 C) (Oral)   Resp 20   Ht 5\' 3"  (  1.6 m)   LMP 08/24/2020   SpO2 100%   BMI 28.34 kg/m   Physical Exam Vitals and nursing note reviewed.  Constitutional:      General: She is not in acute distress.    Appearance: She is well-developed.  HENT:     Head: Atraumatic.  Eyes:     Conjunctiva/sclera: Conjunctivae normal.  Musculoskeletal:        General: Signs of injury (Right thumb: Less than 1 cm superficial vertical laceration noted to the pad of the thumb medially without nail involvement.  It is not actively bleeding, no foreign body noted.  No joint involvement.) present.     Cervical back: Neck supple.  Skin:    Findings: No rash.  Neurological:     Mental Status: She is alert.     ED Results / Procedures / Treatments   Labs (all labs ordered are listed, but only abnormal results are displayed) Labs Reviewed - No data to  display  EKG None  Radiology No results found.  Procedures .Marland KitchenLaceration Repair  Date/Time: 08/26/2020 12:07 PM Performed by: Domenic Moras, PA-C Authorized by: Domenic Moras, PA-C   Consent:    Consent obtained:  Verbal   Consent given by:  Patient   Risks discussed:  Infection, need for additional repair, pain, poor cosmetic result and poor wound healing   Alternatives discussed:  No treatment and delayed treatment Universal protocol:    Procedure explained and questions answered to patient or proxy's satisfaction: yes     Relevant documents present and verified: yes     Test results available and properly labeled: yes     Imaging studies available: yes     Required blood products, implants, devices, and special equipment available: yes     Site/side marked: yes     Immediately prior to procedure, a time out was called: yes     Patient identity confirmed:  Verbally with patient Anesthesia (see MAR for exact dosages):    Anesthesia method:  None Laceration details:    Location:  Finger   Finger location:  R thumb   Length (cm):  1   Depth (mm):  2 Repair type:    Repair type:  Simple Exploration:    Wound extent: no muscle damage noted and no underlying fracture noted     Contaminated: no   Treatment:    Area cleansed with:  Betadine   Irrigation solution:  Sterile saline Skin repair:    Repair method:  Tissue adhesive Approximation:    Approximation:  Close Post-procedure details:    Dressing:  Open (no dressing)   Patient tolerance of procedure:  Tolerated well, no immediate complications   (including critical care time)  Medications Ordered in ED Medications - No data to display  ED Course  I have reviewed the triage vital signs and the nursing notes.  Pertinent labs & imaging results that were available during my care of the patient were reviewed by me and considered in my medical decision making (see chart for details).    MDM Rules/Calculators/A&P                           BP (!) 170/112 (BP Location: Left Arm)   Pulse 62   Temp 98 F (36.7 C) (Oral)   Resp 20   Ht 5\' 3"  (1.6 m)   LMP 08/24/2020   SpO2 100%   BMI 28.34 kg/m  Patient noted to be  hypertensive in the emergency department.  No signs of hypertensive urgency.  Discussed with patient the need for close follow-up and management by their primary care physician.    Final Clinical Impression(s) / ED Diagnoses Final diagnoses:  Laceration of right thumb without foreign body without damage to nail, initial encounter    Rx / DC Orders ED Discharge Orders    None     11:35 AM Patient here with superficial laceration noted to right thumb without foreign body involvement no neurovascular injury and laceration does not involve the nail.  Will give Tdap, cleaned the wound and applied Dermabond.   Domenic Moras, PA-C 08/26/20 1212    Quintella Reichert, MD 08/26/20 2122

## 2022-08-05 IMAGING — CT CT RENAL STONE PROTOCOL
2 of 4 series · 16 of 46 positions shown, 18 images · non-contrast
Comparison: 03/05/2017

CLINICAL DATA: 39-year-old with hematuria. Left abdominal pain.
Loose stools.

EXAM:
CT ABDOMEN AND PELVIS WITHOUT CONTRAST
TECHNIQUE: Multidetector CT imaging of the abdomen and pelvis was performed
following the standard protocol without IV contrast.

[Series 2: axial st · axial · 0.64mm/px · z∈[-680,-256]mm · 13 of 96 slices shown, 15 images]
[im 6/96  soft-tissue]
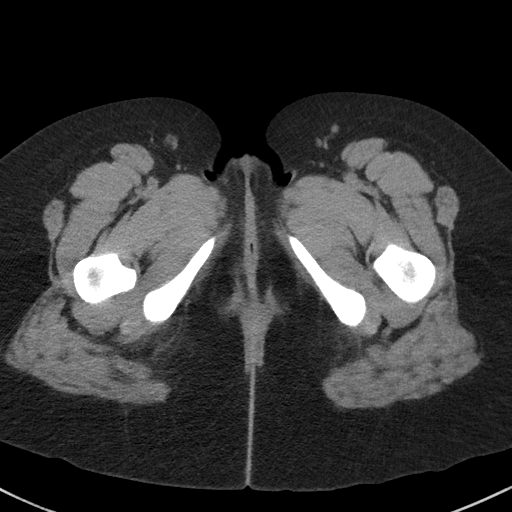
[im 6/96  bone]
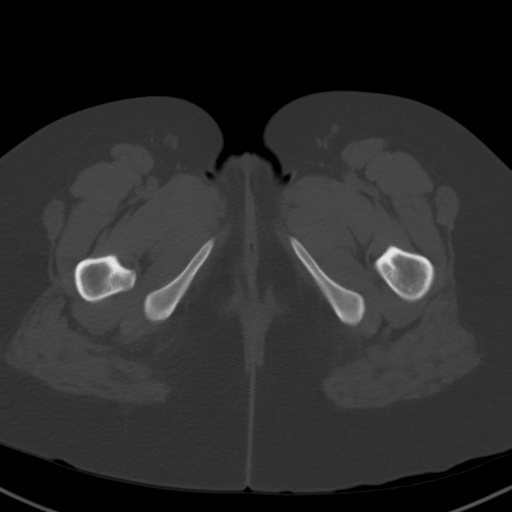
[im 16/96  soft-tissue]
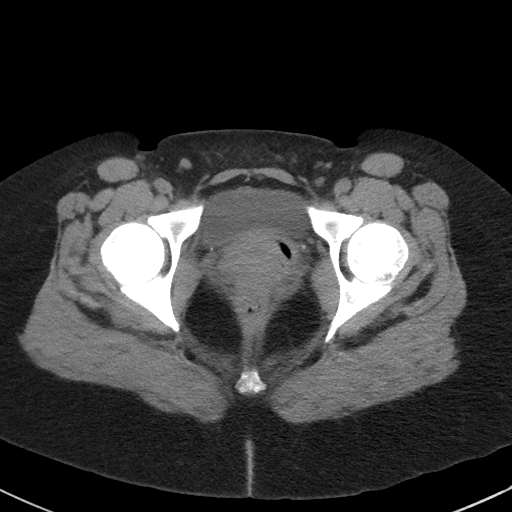
[im 21/96  soft-tissue]
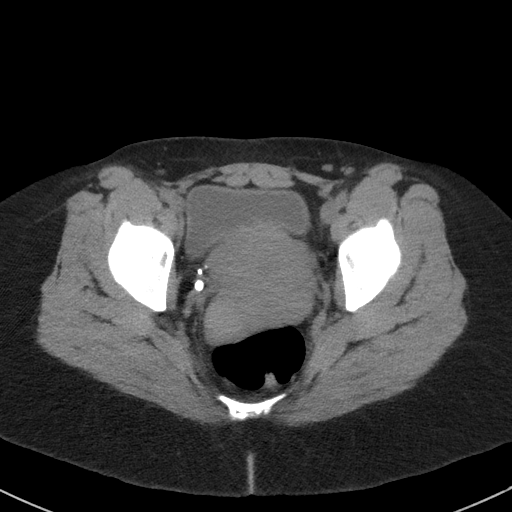
[im 26/96  soft-tissue]
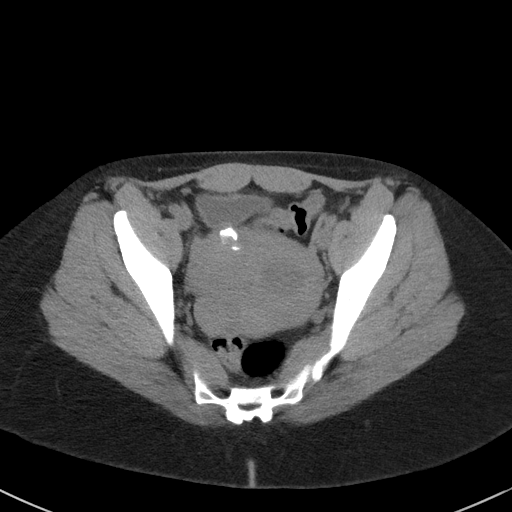
[im 36/96  soft-tissue]
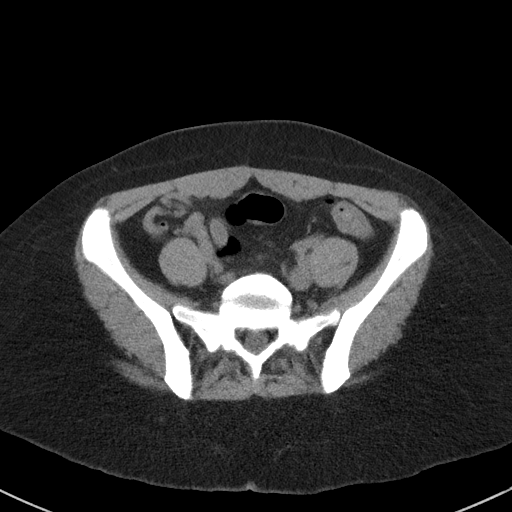
[im 41/96  soft-tissue]
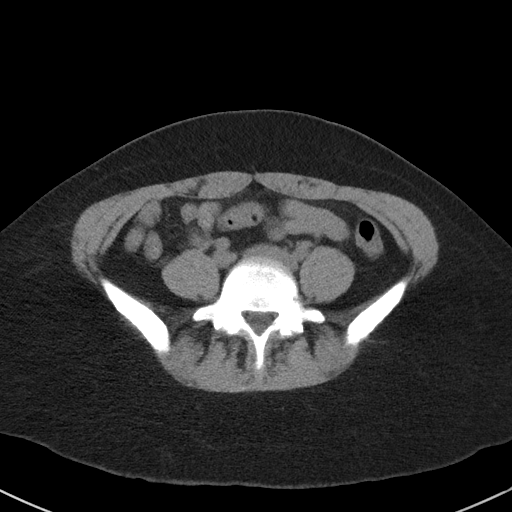
[im 51/96  soft-tissue]
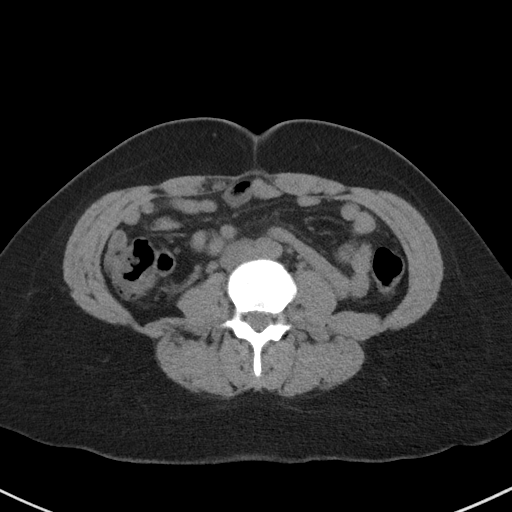
[im 56/96  soft-tissue]
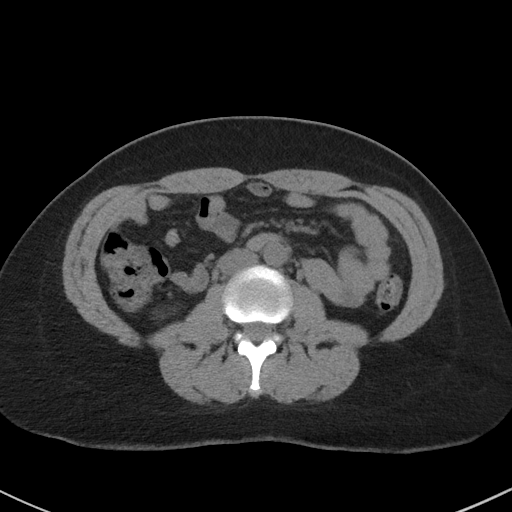
[im 61/96  soft-tissue]
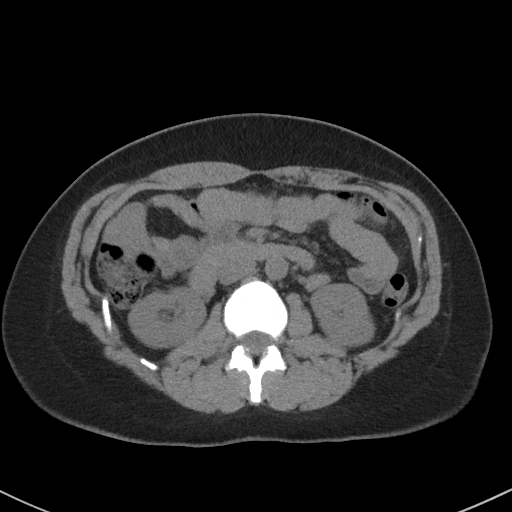
[im 61/96  bone]
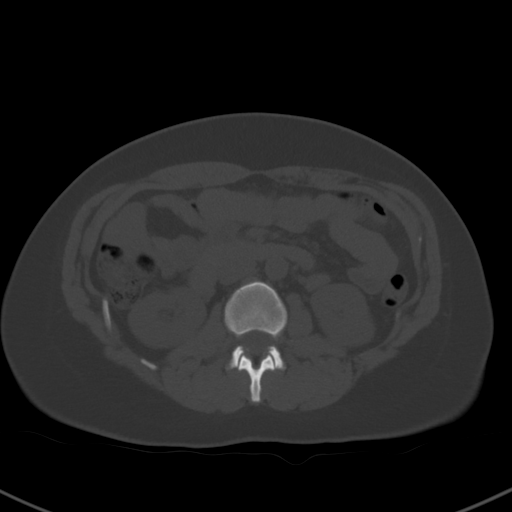
[im 71/96  soft-tissue]
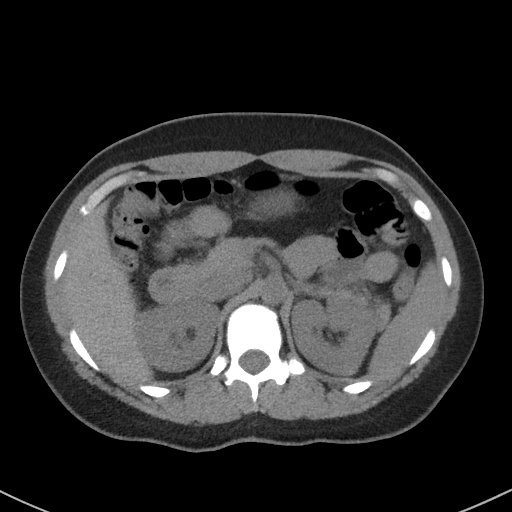
[im 76/96  soft-tissue]
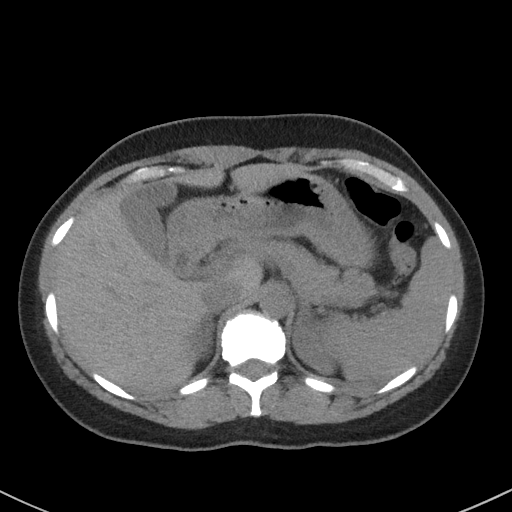
[im 81/96  soft-tissue]
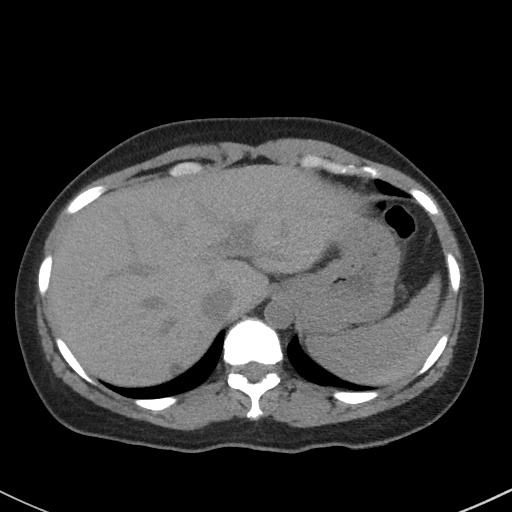
[im 91/96  soft-tissue]
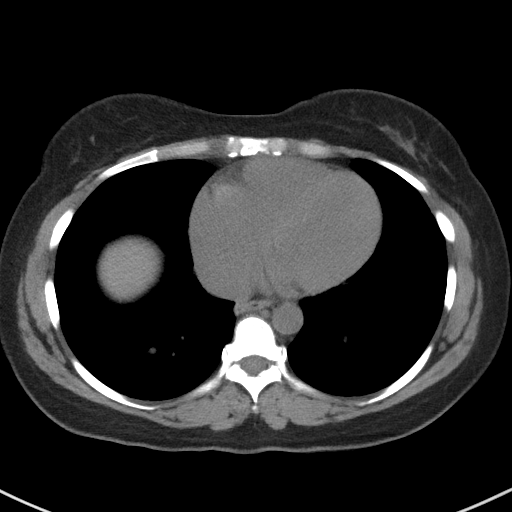

[Series 5: coronal st · coronal · 0.71mm/px · 3 of 83 slices shown]
[im 28/83  soft-tissue]
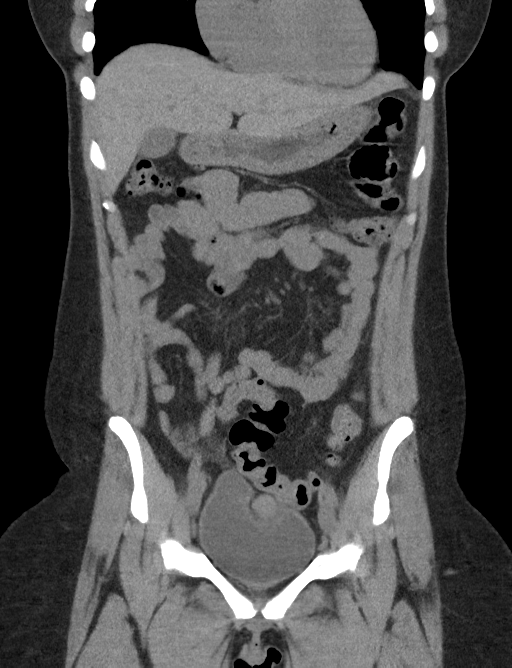
[im 37/83  soft-tissue]
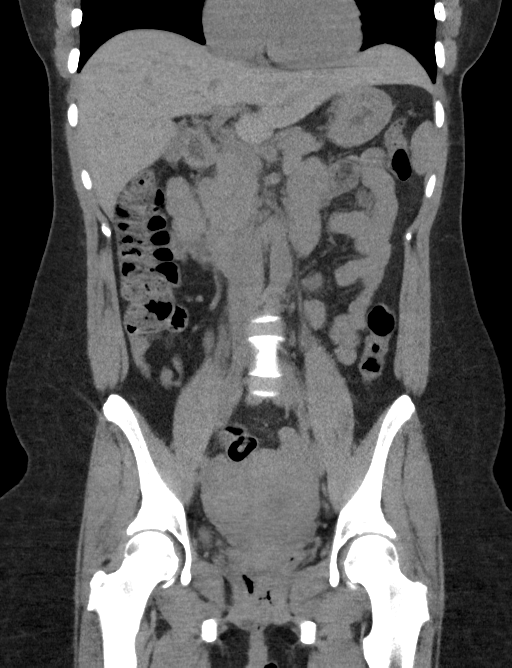
[im 46/83  soft-tissue]
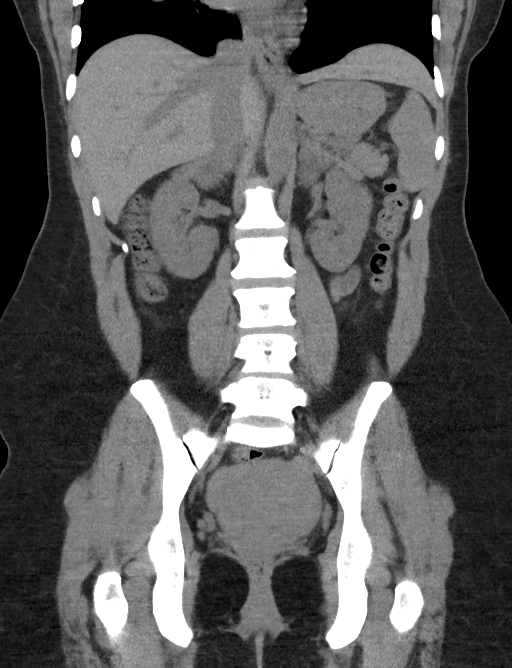

[16 of 46 positions shown; findings below may reference images not displayed]

FINDINGS: Lower chest: Tiny peripheral nodule anterior right middle lobe on
sequence 4, image 5 appears stable and compatible with a benign
finding. Otherwise, lung bases are clear.

Hepatobiliary: Again noted is a small hypodensity along the
posterior right hepatic lobe measuring roughly 8 mm and probably
represents a hepatic cyst. Normal appearance of the gallbladder. No
biliary dilatation.

Pancreas: Unremarkable. No pancreatic ductal dilatation or
surrounding inflammatory changes.

Spleen: Normal in size without focal abnormality.

Adrenals/Urinary Tract: No gross abnormality involving the adrenal
tissue. Negative for kidney stones or hydronephrosis. Normal
appearance of the urinary bladder.

Stomach/Bowel: Stomach is within normal limits. Appendix appears
normal. No evidence of bowel wall thickening, distention, or
inflammatory changes.

Vascular/Lymphatic: No significant vascular findings are present. No
enlarged abdominal or pelvic lymph nodes.

Reproductive: Again noted is an enlarged uterus with evidence of
multiple fibroids. No evidence for adnexal mass. Calcifications
involving an anterior right uterine fibroid.

Other: Negative for free fluid. Negative for free air. Small
umbilical hernia containing fat. Again noted is mild fat stranding
in the right lower quadrant on series 2, image 65. This small amount
of fat stranding appears chronic. No evidence for acute inflammation
in this area.

Musculoskeletal: No acute abnormality. Mild facet arthropathy along
the right side of L5-S1.
IMPRESSION: 1. No acute abnormality in the abdomen or pelvis. Mild fat stranding
in the right lower quadrant appears chronic.
2. Negative for kidney stones or hydronephrosis.
3. Fibroid uterus.
